# Patient Record
Sex: Female | Born: 1975 | Race: White | Hispanic: No | Marital: Married | Smoking: Never smoker
Health system: Southern US, Community
[De-identification: ages and names within clinical notes are randomized; demographics above are authoritative.]

## PROBLEM LIST (undated history)

## (undated) DIAGNOSIS — G43909 Migraine, unspecified, not intractable, without status migrainosus: Secondary | ICD-10-CM

## (undated) DIAGNOSIS — E109 Type 1 diabetes mellitus without complications: Secondary | ICD-10-CM

## (undated) DIAGNOSIS — G4730 Sleep apnea, unspecified: Secondary | ICD-10-CM

## (undated) DIAGNOSIS — M7502 Adhesive capsulitis of left shoulder: Secondary | ICD-10-CM

## (undated) DIAGNOSIS — K219 Gastro-esophageal reflux disease without esophagitis: Secondary | ICD-10-CM

## (undated) DIAGNOSIS — E782 Mixed hyperlipidemia: Secondary | ICD-10-CM

## (undated) DIAGNOSIS — Z794 Long term (current) use of insulin: Secondary | ICD-10-CM

## (undated) DIAGNOSIS — G4731 Primary central sleep apnea: Secondary | ICD-10-CM

## (undated) DIAGNOSIS — L680 Hirsutism: Secondary | ICD-10-CM

## (undated) DIAGNOSIS — K5790 Diverticulosis of intestine, part unspecified, without perforation or abscess without bleeding: Secondary | ICD-10-CM

## (undated) DIAGNOSIS — G5603 Carpal tunnel syndrome, bilateral upper limbs: Secondary | ICD-10-CM

## (undated) DIAGNOSIS — F419 Anxiety disorder, unspecified: Secondary | ICD-10-CM

## (undated) DIAGNOSIS — E063 Autoimmune thyroiditis: Secondary | ICD-10-CM

## (undated) DIAGNOSIS — E039 Hypothyroidism, unspecified: Secondary | ICD-10-CM

## (undated) DIAGNOSIS — E282 Polycystic ovarian syndrome: Secondary | ICD-10-CM

## (undated) HISTORY — DX: Hirsutism: L68.0

## (undated) HISTORY — DX: Polycystic ovarian syndrome: E28.2

## (undated) HISTORY — PX: TRIGGER FINGER RELEASE: SHX641

## (undated) HISTORY — DX: Anxiety disorder, unspecified: F41.9

## (undated) HISTORY — DX: Sleep apnea, unspecified: G47.30

## (undated) HISTORY — DX: Hypothyroidism, unspecified: E03.9

## (undated) HISTORY — PX: ULNAR NERVE DECOMPRESSION: SHX7404

## (undated) HISTORY — DX: Gastro-esophageal reflux disease without esophagitis: K21.9

## (undated) HISTORY — DX: Type 1 diabetes mellitus without complications: E10.9

---

## 2000-11-21 HISTORY — PX: DILATION AND CURETTAGE, DIAGNOSTIC / THERAPEUTIC: SUR384

## 2002-11-21 HISTORY — PX: TUBAL LIGATION: SHX77

## 2006-11-21 HISTORY — PX: HYSTEROSCOPY W/ ENDOMETRIAL ABLATION: SUR665

## 2006-11-21 HISTORY — PX: ABLATION: SHX5711

## 2012-11-21 HISTORY — PX: CHOLECYSTECTOMY: SHX55

## 2014-11-21 HISTORY — PX: CARPAL TUNNEL RELEASE: SHX101

## 2016-11-21 HISTORY — PX: CARPAL TUNNEL RELEASE: SHX101

## 2017-11-21 HISTORY — PX: OTHER SURGICAL HISTORY: SHX169

## 2019-11-22 HISTORY — PX: BREAST BIOPSY: SHX20

## 2020-11-05 LAB — HM MAMMOGRAPHY

## 2021-06-02 LAB — HM MAMMOGRAPHY

## 2021-09-04 ENCOUNTER — Emergency Department: Payer: BC Managed Care – PPO

## 2021-09-04 ENCOUNTER — Encounter: Payer: Self-pay | Admitting: Emergency Medicine

## 2021-09-04 ENCOUNTER — Other Ambulatory Visit: Payer: Self-pay

## 2021-09-04 ENCOUNTER — Emergency Department
Admission: EM | Admit: 2021-09-04 | Discharge: 2021-09-04 | Disposition: A | Discharge status: Home / Self Care | Payer: BC Managed Care – PPO | Attending: Emergency Medicine | Admitting: Emergency Medicine

## 2021-09-04 DIAGNOSIS — M79641 Pain in right hand: Secondary | ICD-10-CM | POA: Insufficient documentation

## 2021-09-04 DIAGNOSIS — W010XXA Fall on same level from slipping, tripping and stumbling without subsequent striking against object, initial encounter: Secondary | ICD-10-CM | POA: Insufficient documentation

## 2021-09-04 MED ORDER — IBUPROFEN 800 MG PO TABS: 800.0000 mg | ORAL_TABLET | Freq: Three times a day (TID) | ORAL | 0 refills | Status: DC | PRN

## 2021-09-04 MED ORDER — OXYCODONE-ACETAMINOPHEN 7.5-325 MG PO TABS: 1.0000 | ORAL_TABLET | Freq: Four times a day (QID) | ORAL | 0 refills | Status: DC | PRN

## 2021-09-04 NOTE — ED Triage Notes (Signed)
Pt reports her dog tripped her last pm and she fell hurting her right hand. Pt states she went to [facility] last om but they did the x-ray wrong and didn't stabilize it and she needs it checked properly and stabilized/splinted correctly

## 2021-09-04 NOTE — Discharge Instructions (Addendum)
No acute findings x-ray of the right hand.  Wear splint and follow-up with orthopedics by calling for an appointment on Monday morning.

## 2021-09-04 NOTE — ED Provider Notes (Signed)
[facility] Emergency Department Provider Note   ____________________________________________   Event Date/Time   First MD Initiated Contact with Patient 09/04/21 1048     (approximate)  I have reviewed the triage vital signs and the nursing notes.   HISTORY  Chief Complaint Hand Injury    HPI Ashley Fox is a 45 y.o. female patient presents for reevaluation of right wrist pain secondary to a trip and fall last night.  Patient states she went to the urgent care clinic was not satisfied with the care that was given.  Patient stated x-ray was performed wrong and he did not stabilize her wrist.  Patient is right-hand dominant.  No obvious deformity, edema, erythema to the right wrist.  Patient is moderate guarding palpation at the distal radius.  Patient has decreased range of motion with hyper flexion.  Patient rates the pain a 7/10.  Patient described pain as "achy".  Ace wrap was applied at the urgent care clinic.         History reviewed. No pertinent past medical history.  There are no problems to display for this patient.   History reviewed. No pertinent surgical history.  Prior to Admission medications   Medication Sig Start Date End Date Taking? Authorizing Provider  ibuprofen (ADVIL) 800 MG tablet Take 1 tablet (800 mg total) by mouth every 8 (eight) hours as needed for moderate pain. 09/04/21  Yes Joni Reining, PA-C  oxyCODONE-acetaminophen (PERCOCET) 7.5-325 MG tablet Take 1 tablet by mouth every 6 (six) hours as needed for severe pain. 09/04/21  Yes Joni Reining, PA-C    Allergies Erythromycin and Sulfa antibiotics  No family history on file.  Social History    Review of Systems  Constitutional: No fever/chills Eyes: No visual changes. ENT: No sore throat. Cardiovascular: Denies chest pain. Respiratory: Denies shortness of breath. Gastrointestinal: No abdominal pain.  No nausea, no vomiting.  No diarrhea.  No  constipation. Genitourinary: Negative for dysuria. Musculoskeletal: Negative for back pain. Skin: Negative for rash. Neurological: Negative for headaches, focal weakness or numbness. Allergic/Immunilogical: Erythromycin and sulfa antibiotics. ____________________________________________   PHYSICAL EXAM:  VITAL SIGNS: ED Triage Vitals  Enc Vitals Group     BP 09/04/21 1043 121/66     Pulse Rate 09/04/21 1043 82     Resp 09/04/21 1043 14     Temp 09/04/21 1043 97.8 F (36.6 C)     Temp Source 09/04/21 1043 Oral     SpO2 09/04/21 1043 98 %     Weight 09/04/21 1033 175 lb (79.4 kg)     Height 09/04/21 1033 5\' 5"  (1.651 m)     Head Circumference --      Peak Flow --      Pain Score 09/04/21 1033 7     Pain Loc --      Pain Edu? --      Excl. in GC? --     Constitutional: Alert and oriented. Well appearing and in no acute distress. Nose: No congestion/rhinnorhea. Mouth/Throat: Mucous membranes are moist.  Oropharynx non-erythematous. Neck: No stridor.  No cervical spine tenderness to palpation. Hematological/Lymphatic/Immunilogical: No cervical lymphadenopathy. Cardiovascular: Normal rate, regular rhythm. Grossly normal heart sounds.  Good peripheral circulation. Respiratory: Normal respiratory effort.  No retractions. Lungs CTAB. Musculoskeletal: No obvious deformity to the right wrist.  Decreased range of motion with hyperflexion.   Neurologic:  Normal speech and language. No gross focal neurologic deficits are appreciated. No gait instability. Skin:  Skin  is warm, dry and intact.  No abrasion, ecchymosis, or edema.  Psychiatric: Mood and affect are normal. Speech and behavior are normal.  ____________________________________________   LABS (all labs ordered are listed, but only abnormal results are displayed)  Labs Reviewed - No data to display ____________________________________________  EKG   ____________________________________________  RADIOLOGY I, Joni Reining, personally viewed and evaluated these images (plain radiographs) as part of my medical decision making, as well as reviewing the written report by the radiologist.  ED MD interpretation: No acute findings x-ray of the right hand.  Official radiology report(s): DG Hand Complete Right  Result Date: 09/04/2021 CLINICAL DATA:  Fall, right hand pain EXAM: RIGHT HAND - COMPLETE 3+ VIEW COMPARISON:  None. FINDINGS: There is no acute fracture or dislocation. Bony alignment is normal. The joint spaces are preserved. There is no erosive change. The soft tissues are unremarkable. IMPRESSION: No acute fracture or dislocation. Electronically Signed   By: Lesia Hausen M.D.   On: 09/04/2021 11:20    ____________________________________________   PROCEDURES  Procedure(s) performed (including Critical Care):  Procedures   ____________________________________________   INITIAL IMPRESSION / ASSESSMENT AND PLAN / ED COURSE  As part of my medical decision making, I reviewed the following data within the electronic MEDICAL RECORD NUMBER         Patient presents for reevaluation of right hand pain secondary to a trip and fall.  Discussed with patient no acute findings on x-ray.  Patient complaint and physical exam consistent with hyperextension injury of the right hand.  Patient placed in a splint and given discharge care instructions.  Patient advised if no improvement in 2 days follow orthopedic listed in her discharge care instructions.      ____________________________________________   FINAL CLINICAL IMPRESSION(S) / ED DIAGNOSES  Final diagnoses:  Right hand pain     ED Discharge Orders          Ordered    ibuprofen (ADVIL) 800 MG tablet  Every 8 hours PRN        09/04/21 1138    oxyCODONE-acetaminophen (PERCOCET) 7.5-325 MG tablet  Every 6 hours PRN        09/04/21 1138             Note:  This document was prepared using Dragon voice recognition software and may include  unintentional dictation errors.    Joni Reining, PA-C 09/04/21 1146    Sharyn Creamer, MD 09/04/21 [phone]

## 2021-09-15 ENCOUNTER — Other Ambulatory Visit: Payer: Self-pay | Admitting: Physician Assistant

## 2021-09-15 DIAGNOSIS — M25531 Pain in right wrist: Secondary | ICD-10-CM

## 2021-09-24 ENCOUNTER — Ambulatory Visit: Admission: RE | Admit: 2021-09-24 | Payer: BC Managed Care – PPO | Source: Ambulatory Visit

## 2021-12-28 ENCOUNTER — Ambulatory Visit: Payer: BC Managed Care – PPO | Admitting: Obstetrics and Gynecology

## 2021-12-28 ENCOUNTER — Other Ambulatory Visit (HOSPITAL_COMMUNITY)
Admission: RE | Admit: 2021-12-28 | Discharge: 2021-12-28 | Disposition: A | Discharge status: Home / Self Care | Payer: BC Managed Care – PPO | Source: Ambulatory Visit | Attending: Obstetrics and Gynecology | Admitting: Obstetrics and Gynecology

## 2021-12-28 ENCOUNTER — Encounter: Payer: Self-pay | Admitting: Obstetrics and Gynecology

## 2021-12-28 ENCOUNTER — Other Ambulatory Visit: Payer: Self-pay

## 2021-12-28 VITALS — Ht 65.0 in | Wt 179.8 lb

## 2021-12-28 DIAGNOSIS — Z1231 Encounter for screening mammogram for malignant neoplasm of breast: Secondary | ICD-10-CM | POA: Diagnosis not present

## 2021-12-28 DIAGNOSIS — Z01419 Encounter for gynecological examination (general) (routine) without abnormal findings: Secondary | ICD-10-CM | POA: Diagnosis not present

## 2021-12-28 DIAGNOSIS — R102 Pelvic and perineal pain: Secondary | ICD-10-CM

## 2021-12-28 DIAGNOSIS — N951 Menopausal and female climacteric states: Secondary | ICD-10-CM

## 2021-12-28 NOTE — Progress Notes (Signed)
HPI:      Ashley Fox is a 46 y.o. [phone] who LMP was No LMP recorded. Patient has had an ablation.  Subjective:   She presents today for her annual examination.  She is also here to establish care at [facility]. She is due for a mammogram.  She did have a Pap Co-test a little more than 3 years ago. She is an insulin-dependent diabetic who is seen regularly by endocrinology. She has previously had a tubal ligation for birth control and an endometrial ablation for very heavy menstrual bleeding. She has recently begun to have signs and symptoms of her menses despite not having any bleeding.  These include pelvic pressure and cramping, occasional diarrhea. She has also stated that she has had issues with premenopausal symptoms including hot flashes and night sweats for many years.    Hx: The following portions of the patient's history were reviewed and updated as appropriate:             She  has a past medical history of Acid reflux, Anxiety, Hirsutism, Hypothyroid, PCOS (polycystic ovarian syndrome), Sleep apnea, and Type 1 diabetes ([place]). She does not have a problem list on file. She  has a past surgical history that includes Dilation and curettage, diagnostic / therapeutic (2002); Cesarean section (2004); Tubal ligation (2004); Ablation (2008); Cholecystectomy (2014); Carpal tunnel release (Right, 2016); Carpal tunnel release (Left, 2018); and Other surgical history (Right, 2019). Her family history includes Cirrhosis in her paternal grandmother; Crohn's disease in her father; Dementia in her maternal grandmother; Diabetes type I in her paternal grandfather; Diabetes type II in her father; Heart attack in her maternal grandfather; Heart disease in her maternal uncle, maternal uncle, and paternal grandfather; Hypertension in her father, maternal grandfather, maternal grandmother, mother, and paternal grandfather; Lung cancer in her paternal aunt; Sick sinus syndrome in her father. She  reports  that she has never smoked. She has never used smokeless tobacco. She reports current alcohol use. She reports that she does not use drugs. She has a current medication list which includes the following prescription(s): cyanocobalamin, empagliflozin, ergocalciferol, escitalopram, fenofibrate, insulin aspart, tresiba flextouch, levocetirizine dihydrochloride, levothyroxine, omeprazole, rosuvastatin, semaglutide, zolpidem tartrate, ibuprofen, and oxycodone-acetaminophen. She is allergic to avocado, erythromycin, imitrex [sumatriptan], and sulfa antibiotics.       Review of Systems:  Review of Systems  Constitutional: Denied constitutional symptoms, night sweats, recent illness, fatigue, fever, insomnia and weight loss.  Eyes: Denied eye symptoms, eye pain, photophobia, vision change and visual disturbance.  Ears/Nose/Throat/Neck: Denied ear, nose, throat or neck symptoms, hearing loss, nasal discharge, sinus congestion and sore throat.  Cardiovascular: Denied cardiovascular symptoms, arrhythmia, chest pain/pressure, edema, exercise intolerance, orthopnea and palpitations.  Respiratory: Denied pulmonary symptoms, asthma, pleuritic pain, productive sputum, cough, dyspnea and wheezing.  Gastrointestinal: Denied, gastro-esophageal reflux, melena, nausea and vomiting.  Genitourinary: See HPI for additional information.  Musculoskeletal: Denied musculoskeletal symptoms, stiffness, swelling, muscle weakness and myalgia.  Dermatologic: Denied dermatology symptoms, rash and scar.  Neurologic: Denied neurology symptoms, dizziness, headache, neck pain and syncope.  Psychiatric: Denied psychiatric symptoms, anxiety and depression.  Endocrine: See HPI for additional information.   Meds:   Current Outpatient Medications on File Prior to Visit  Medication Sig Dispense Refill   Cyanocobalamin (B-12 COMPLIANCE INJECTION IJ) Inject as directed every 30 (thirty) days.     empagliflozin (JARDIANCE) 25 MG TABS  tablet Take by mouth daily.     ergocalciferol (VITAMIN D2) 1.25 MG (50000 UT) capsule Take 50,000  Units by mouth once a week.     escitalopram (LEXAPRO) 20 MG tablet Take 20 mg by mouth daily.     fenofibrate (TRICOR) 145 MG tablet Take 145 mg by mouth daily.     Insulin Aspart (NOVOLOG FLEXPEN [place]) Inject into the skin.     insulin degludec (TRESIBA FLEXTOUCH) 200 UNIT/ML FlexTouch Pen Inject 70 Units into the skin in the morning and at bedtime.     Levocetirizine Dihydrochloride (XYZAL PO) Take 5 mg by mouth.     levothyroxine (SYNTHROID) 88 MCG tablet Take 88 mcg by mouth daily before breakfast.     omeprazole (PRILOSEC) 20 MG capsule Take 20 mg by mouth daily.     rosuvastatin (CRESTOR) 10 MG tablet Take 10 mg by mouth daily.     Semaglutide (OZEMPIC, 1 MG/DOSE, [place]) Inject into the skin once a week.     Zolpidem Tartrate (AMBIEN PO) Take by mouth. PRN     ibuprofen (ADVIL) 800 MG tablet Take 1 tablet (800 mg total) by mouth every 8 (eight) hours as needed for moderate pain. 15 tablet 0   oxyCODONE-acetaminophen (PERCOCET) 7.5-325 MG tablet Take 1 tablet by mouth every 6 (six) hours as needed for severe pain. 12 tablet 0   No current facility-administered medications on file prior to visit.     Objective:     There were no vitals filed for this visit.  Filed Weights   12/28/21 0952  Weight: 179 lb 12.8 oz (81.6 kg)              Physical examination General NAD, Conversant  HEENT Atraumatic; Op clear with mmm.  Normo-cephalic. Pupils reactive. Anicteric sclerae  Thyroid/Neck Smooth without nodularity or enlargement. Normal ROM.  Neck Supple.  Skin No rashes, lesions or ulceration. Normal palpated skin turgor. No nodularity.  Breasts: No masses or discharge.  Symmetric.  No axillary adenopathy.  Lungs: Clear to auscultation.No rales or wheezes. Normal Respiratory effort, no retractions.  Heart: NSR.  No murmurs or rubs appreciated. No periferal edema  Abdomen: Soft.  Non-tender.   No masses.  No HSM. No hernia  Extremities: Moves all appropriately.  Normal ROM for age. No lymphadenopathy.  Neuro: Oriented to PPT.  Normal mood. Normal affect.     Pelvic:   Vulva: Normal appearance.  No lesions.  Vagina: No lesions or abnormalities noted.  Support: Normal pelvic support.  Urethra No masses tenderness or scarring.  Meatus Normal size without lesions or prolapse.  Cervix: Normal appearance.  No lesions.  Anus: Normal exam.  No lesions.  Perineum: Normal exam.  No lesions.        Bimanual   Uterus: Normal size.  Non-tender.  Mobile.  AV.  Adnexae: No masses.  Non-tender to palpation.  Cul-de-sac: Negative for abnormality.     Assessment:    MD:[id number] There are no problems to display for this patient.    1. Well woman exam with routine gynecological exam   2. Screening mammogram for breast cancer   3. Symptomatic menopausal or female climacteric states   4. Pelvic pain in female     Pelvic pain discussed.  Future ultrasound the possibility if pain becomes more significant.  Tubal ligation ablation syndrome discussed (patient will explore this condition in "up-to-date")   Plan:            1.  Basic Screening Recommendations The basic screening recommendations for asymptomatic women were discussed with the patient during her visit.  The age-appropriate recommendations  were discussed with her and the rational for the tests reviewed.  When I am informed by the patient that another primary care physician has previously obtained the age-appropriate tests and they are up-to-date, only outstanding tests are ordered and referrals given as necessary.  Abnormal results of tests will be discussed with her when all of her results are completed.  Routine preventative health maintenance measures emphasized: Exercise/Diet/Weight control, Tobacco Warnings, Alcohol/Substance use risks and Stress Management Pap Co-test performed-mammogram ordered 2.  FSH to rule out menopause -if  elevated consider discussion regarding ERT. 3.  Patient will inform us if her pelvic pain worsens -consider future ultrasound  Orders Orders Placed This Encounter  Procedures   MM DIGITAL SCREENING BILATERAL   Follicle stimulating hormone    No orders of the defined types were placed in this encounter.         F/U  Return in about 1 year (around 12/28/2022) for We will contact her with any abnormal test results, Pt to contact us if symptoms worsen, Annual.  Finis Bud, M.D. 12/28/2021 10:47 AM

## 2021-12-28 NOTE — Progress Notes (Signed)
Patient presents today for annual exam. Patient states a history of ablation, due to heavy bleeding and tubal ligation. Patient states experiencing cramps, headaches, diarrhea every 2-3 weeks starting 4-6 months ago. Patient also states experiencing perimenopausal symptoms of vaginal dryness, night seats and occasional urinary leakage. Patient is due for pap smear and mammogram has been ordered. Patient states no other questions or concerns at this time.

## 2021-12-29 LAB — CYTOLOGY - PAP
Comment: NEGATIVE
Diagnosis: NEGATIVE
High risk HPV: NEGATIVE

## 2021-12-29 LAB — FOLLICLE STIMULATING HORMONE: FSH: 8.6 m[IU]/mL

## 2022-02-15 ENCOUNTER — Ambulatory Visit: Payer: BC Managed Care – PPO | Admitting: Nurse Practitioner

## 2022-02-15 ENCOUNTER — Other Ambulatory Visit: Payer: Self-pay

## 2022-02-15 DIAGNOSIS — R300 Dysuria: Secondary | ICD-10-CM

## 2022-02-15 DIAGNOSIS — N3090 Cystitis, unspecified without hematuria: Secondary | ICD-10-CM

## 2022-02-15 LAB — POCT URINALYSIS DIPSTICK OB
Bilirubin, UA: NEGATIVE
Ketones, UA: NEGATIVE
Nitrite, UA: NEGATIVE
Spec Grav, UA: 1.015 (ref 1.010–1.025)
Urobilinogen, UA: NEGATIVE E.U./dL — AB
pH, UA: 7 (ref 5.0–8.0)

## 2022-02-15 MED ORDER — NITROFURANTOIN MONOHYD MACRO 100 MG PO CAPS: 100.0000 mg | ORAL_CAPSULE | Freq: Two times a day (BID) | ORAL | 0 refills | Status: AC

## 2022-02-15 NOTE — Progress Notes (Signed)
46 year old female presenting with dysuria, frequency and urgency for the past 3 days. Slight back pain today.She did have some nausea yesterday no fever.  ? ?Allergies  ?Allergen Reactions  ? Avocado Anaphylaxis  ? Erythromycin Nausea And Vomiting  ? Imitrex [Sumatriptan] Other (See Comments)  ?  Head feels like it is going to "explode"  ? Sulfa Antibiotics Hives  ?  ?Recent Results (from the past 2160 hour(s))  ?Cytology - PAP     Status: None  ? Collection Time: 12/28/21 10:24 AM  ?Result Value Ref Range  ? High risk HPV Negative   ? Adequacy    ?  Satisfactory for evaluation; transformation zone component PRESENT.  ? Diagnosis    ?  - Negative for intraepithelial lesion or malignancy (NILM)  ? Comment Normal Reference Range HPV - Negative   ?Follicle stimulating hormone     Status: None  ? Collection Time: 12/28/21 11:05 AM  ?Result Value Ref Range  ? FSH 8.6 mIU/mL  ?  Comment:                     Adult Female: ?                      Follicular phase      3.5 -  12.5 ?                      Ovulation phase       4.7 -  21.5 ?                      Luteal phase          1.7 -   7.7 ?                      Postmenopausal       25.8 - 134.8 ?  ?POC Urinalysis Dipstick OB     Status: Abnormal  ? Collection Time: 02/15/22  8:04 AM  ?Result Value Ref Range  ? Color, UA amber   ? Clarity, UA cloudy   ? Glucose, UA Moderate (2+) (A) Negative  ? Bilirubin, UA negative   ? Ketones, UA negative   ? Spec Grav, UA 1.015 1.010 - 1.025  ? Blood, UA large   ? pH, UA 7.0 5.0 - 8.0  ? POC,PROTEIN,UA Small (1+) Negative, Trace, Small (1+), Moderate (2+), Large (3+), 4+  ? Urobilinogen, UA negative (A) 0.2 or 1.0 E.U./dL  ? Nitrite, UA negative   ? Leukocytes, UA Small (1+) (A) Negative  ? Appearance    ? Odor    ?  ?1. Cystitis ? ?- nitrofurantoin, macrocrystal-monohydrate, (MACROBID) 100 MG capsule; Take 1 capsule (100 mg total) by mouth 2 (two) times daily for 5 days.  Dispense: 10 capsule; Refill: 0 ? ?2. Dysuria ? ?- POC  Urinalysis Dipstick OB ?- Urine Culture ?   ? ?Will followup with culture results when available  ? ?

## 2022-02-16 MED ORDER — CEPHALEXIN 500 MG PO CAPS: 500.0000 mg | ORAL_CAPSULE | Freq: Two times a day (BID) | ORAL | 0 refills | Status: AC

## 2022-02-16 NOTE — Addendum Note (Signed)
Addended by: Harlow Mares on: 02/16/2022 07:37 AM ? ? Modules accepted: Orders ? ?

## 2022-02-17 ENCOUNTER — Ambulatory Visit
Admission: RE | Admit: 2022-02-17 | Discharge: 2022-02-17 | Disposition: A | Discharge status: Home / Self Care | Payer: BC Managed Care – PPO | Source: Ambulatory Visit | Attending: Obstetrics and Gynecology | Admitting: Obstetrics and Gynecology

## 2022-02-17 DIAGNOSIS — Z01419 Encounter for gynecological examination (general) (routine) without abnormal findings: Secondary | ICD-10-CM | POA: Insufficient documentation

## 2022-02-17 DIAGNOSIS — Z1231 Encounter for screening mammogram for malignant neoplasm of breast: Secondary | ICD-10-CM | POA: Diagnosis present

## 2022-02-21 LAB — URINE CULTURE

## 2022-02-22 ENCOUNTER — Encounter: Payer: Self-pay | Admitting: Nurse Practitioner

## 2022-02-23 ENCOUNTER — Inpatient Hospital Stay
Admission: RE | Admit: 2022-02-23 | Discharge: 2022-02-23 | Disposition: A | Discharge status: Home / Self Care | Payer: Self-pay | Source: Ambulatory Visit | Attending: *Deleted | Admitting: *Deleted

## 2022-02-23 ENCOUNTER — Other Ambulatory Visit: Payer: Self-pay | Admitting: *Deleted

## 2022-02-23 DIAGNOSIS — Z1231 Encounter for screening mammogram for malignant neoplasm of breast: Secondary | ICD-10-CM

## 2022-06-20 DIAGNOSIS — K5730 Diverticulosis of large intestine without perforation or abscess without bleeding: Secondary | ICD-10-CM | POA: Diagnosis not present

## 2022-06-20 DIAGNOSIS — K640 First degree hemorrhoids: Secondary | ICD-10-CM | POA: Diagnosis not present

## 2022-06-20 HISTORY — PX: COLONOSCOPY: SHX174

## 2022-11-09 IMAGING — MG MM DIGITAL SCREENING BILAT W/ TOMO AND CAD
8 series · 8 of 24 positions shown · non-contrast
Comparison: Previous exam(s).

CLINICAL DATA: Screening.

EXAM:
DIGITAL SCREENING BILATERAL MAMMOGRAM WITH TOMOSYNTHESIS AND CAD
TECHNIQUE: Bilateral screening digital craniocaudal and mediolateral oblique
mammograms were obtained. Bilateral screening digital breast
tomosynthesis was performed. The images were evaluated with
computer-aided detection.

[R MLO synth-2D]
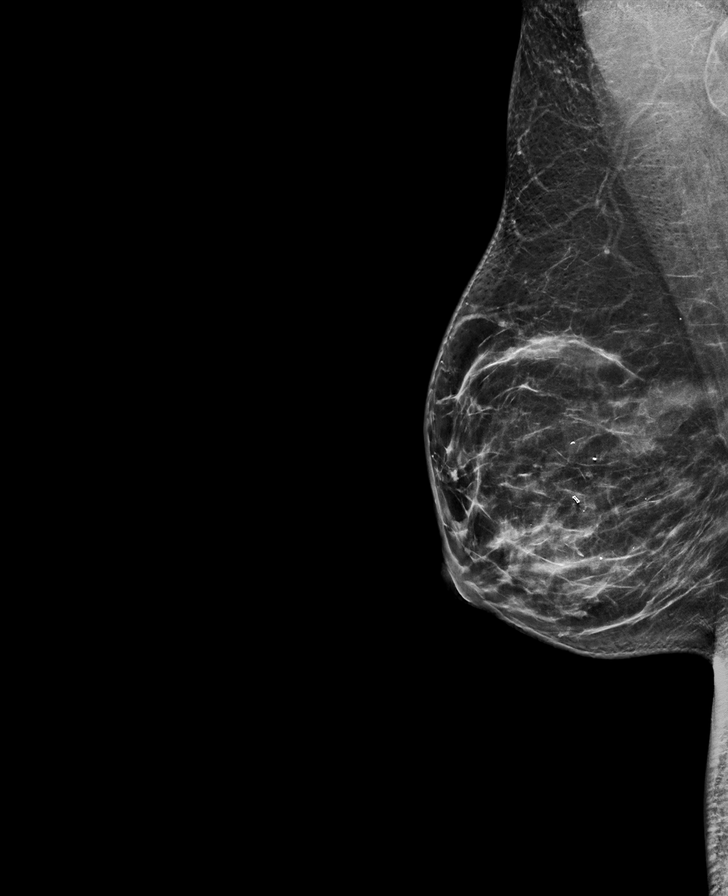

[L CC synth-2D]
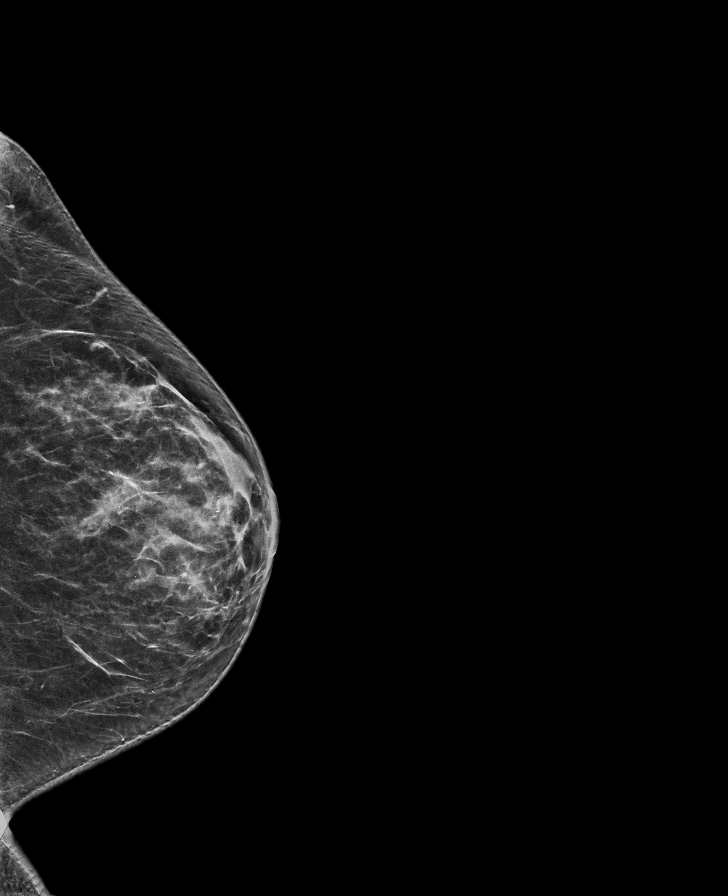

[L MLO synth-2D]
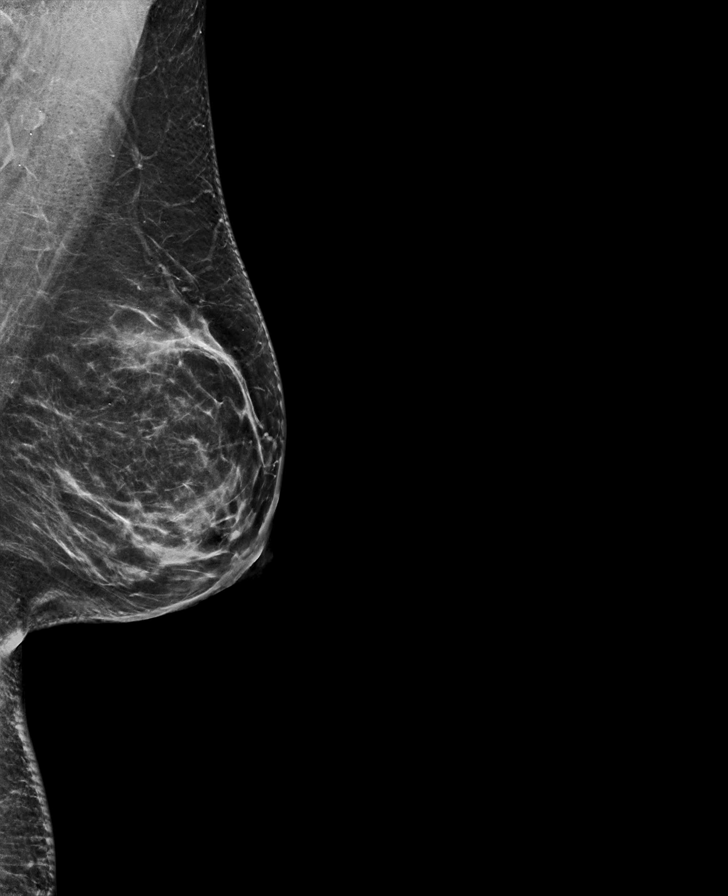

[R CC synth-2D]
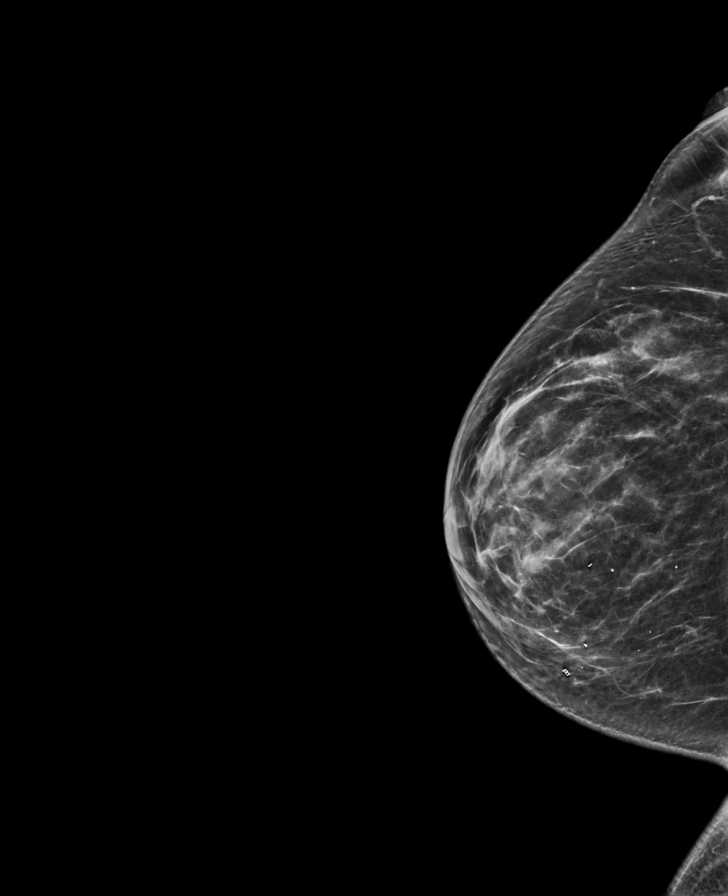

[L CC tomo · tomo slice 37/74.0]
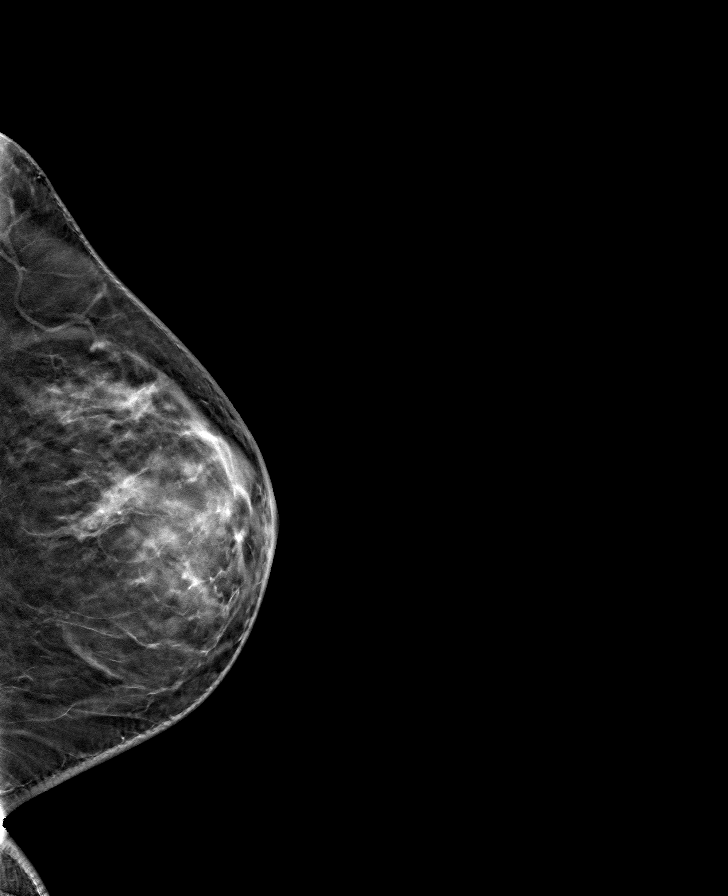

[L MLO tomo · tomo slice 41/80.0]
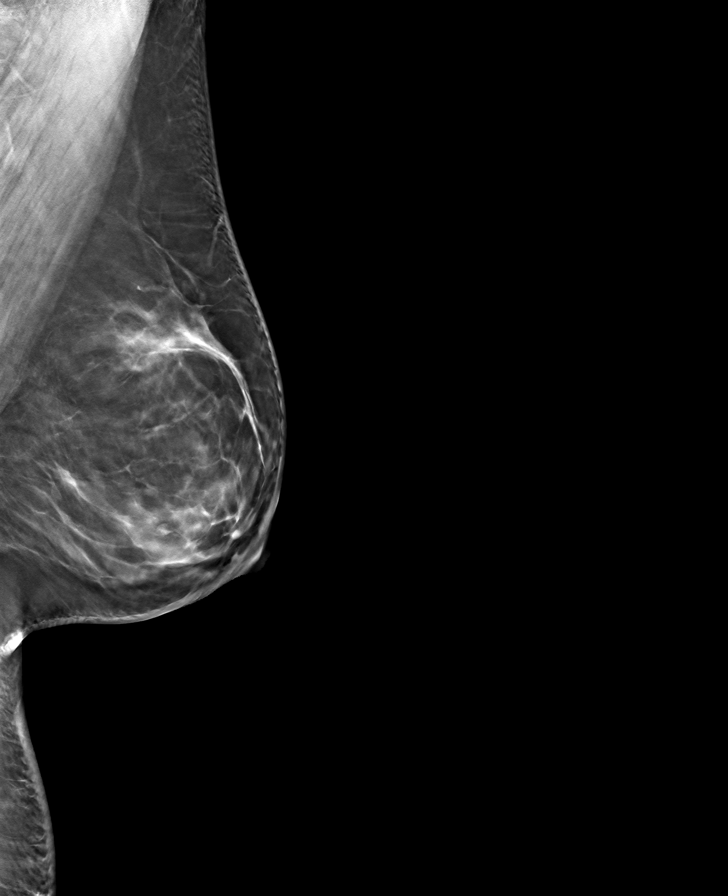

[R CC tomo · tomo slice 39/76.0]
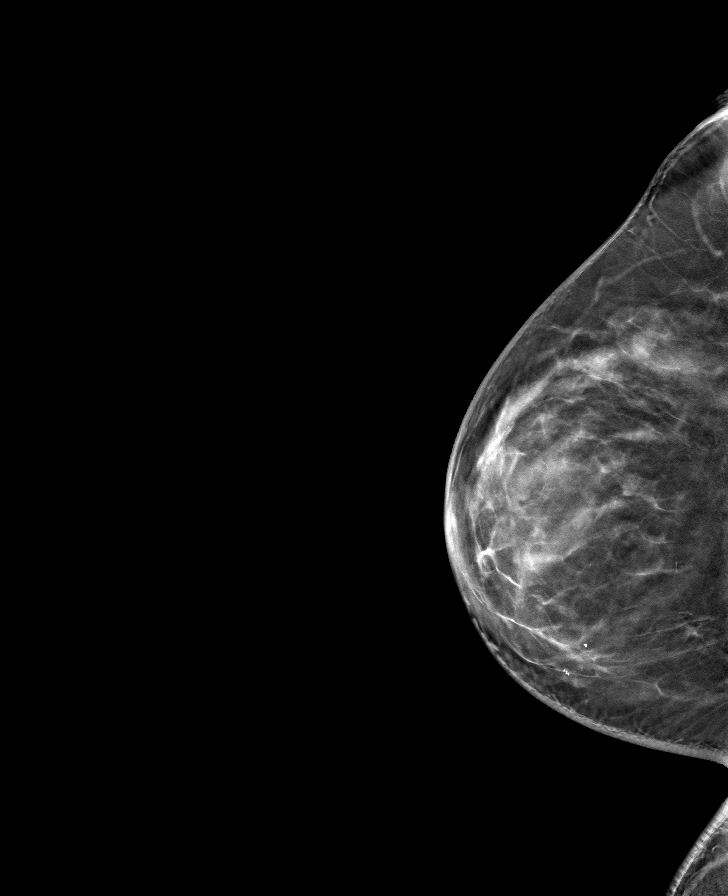

[R MLO tomo · tomo slice 41/81.0]
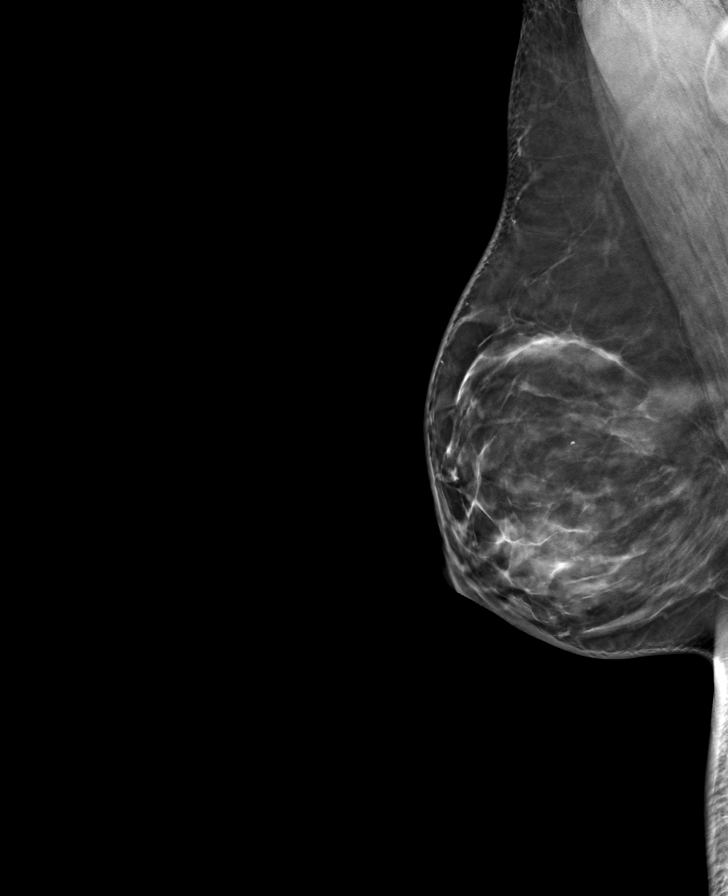

[8 of 24 positions shown; findings below may reference images not displayed]

ACR Breast Density Category c: The breast tissue is heterogeneously
dense, which may obscure small masses.
FINDINGS: There are no findings suspicious for malignancy.
IMPRESSION: No mammographic evidence of malignancy. A result letter of this
screening mammogram will be mailed directly to the patient.

RECOMMENDATION:
Screening mammogram in one year. (Code:Q3-W-BC3)

BI-RADS CATEGORY  1: Negative.

## 2022-11-28 DIAGNOSIS — Z794 Long term (current) use of insulin: Secondary | ICD-10-CM | POA: Diagnosis not present

## 2022-11-28 DIAGNOSIS — E782 Mixed hyperlipidemia: Secondary | ICD-10-CM | POA: Diagnosis not present

## 2022-11-28 DIAGNOSIS — E113293 Type 2 diabetes mellitus with mild nonproliferative diabetic retinopathy without macular edema, bilateral: Secondary | ICD-10-CM | POA: Diagnosis not present

## 2022-12-05 DIAGNOSIS — Z794 Long term (current) use of insulin: Secondary | ICD-10-CM | POA: Diagnosis not present

## 2022-12-05 DIAGNOSIS — E782 Mixed hyperlipidemia: Secondary | ICD-10-CM | POA: Diagnosis not present

## 2022-12-05 DIAGNOSIS — E038 Other specified hypothyroidism: Secondary | ICD-10-CM | POA: Diagnosis not present

## 2022-12-05 DIAGNOSIS — E113293 Type 2 diabetes mellitus with mild nonproliferative diabetic retinopathy without macular edema, bilateral: Secondary | ICD-10-CM | POA: Diagnosis not present

## 2022-12-05 DIAGNOSIS — E063 Autoimmune thyroiditis: Secondary | ICD-10-CM | POA: Diagnosis not present

## 2022-12-23 DIAGNOSIS — E538 Deficiency of other specified B group vitamins: Secondary | ICD-10-CM | POA: Diagnosis not present

## 2022-12-23 DIAGNOSIS — E559 Vitamin D deficiency, unspecified: Secondary | ICD-10-CM | POA: Diagnosis not present

## 2022-12-23 DIAGNOSIS — E039 Hypothyroidism, unspecified: Secondary | ICD-10-CM | POA: Diagnosis not present

## 2022-12-23 DIAGNOSIS — E1069 Type 1 diabetes mellitus with other specified complication: Secondary | ICD-10-CM | POA: Diagnosis not present

## 2022-12-23 DIAGNOSIS — G4700 Insomnia, unspecified: Secondary | ICD-10-CM | POA: Diagnosis not present

## 2022-12-23 DIAGNOSIS — E1065 Type 1 diabetes mellitus with hyperglycemia: Secondary | ICD-10-CM | POA: Diagnosis not present

## 2022-12-23 DIAGNOSIS — F411 Generalized anxiety disorder: Secondary | ICD-10-CM | POA: Diagnosis not present

## 2022-12-23 DIAGNOSIS — K219 Gastro-esophageal reflux disease without esophagitis: Secondary | ICD-10-CM | POA: Diagnosis not present

## 2022-12-23 DIAGNOSIS — Z1331 Encounter for screening for depression: Secondary | ICD-10-CM | POA: Diagnosis not present

## 2022-12-23 DIAGNOSIS — E10319 Type 1 diabetes mellitus with unspecified diabetic retinopathy without macular edema: Secondary | ICD-10-CM | POA: Diagnosis not present

## 2022-12-23 DIAGNOSIS — G4733 Obstructive sleep apnea (adult) (pediatric): Secondary | ICD-10-CM | POA: Diagnosis not present

## 2022-12-23 DIAGNOSIS — Z0000 Encounter for general adult medical examination without abnormal findings: Secondary | ICD-10-CM | POA: Diagnosis not present

## 2022-12-23 DIAGNOSIS — K582 Mixed irritable bowel syndrome: Secondary | ICD-10-CM | POA: Diagnosis not present

## 2022-12-29 ENCOUNTER — Encounter: Payer: BC Managed Care – PPO | Admitting: Obstetrics and Gynecology

## 2023-01-16 ENCOUNTER — Other Ambulatory Visit: Payer: Self-pay

## 2023-01-16 ENCOUNTER — Encounter: Payer: Self-pay | Admitting: *Deleted

## 2023-01-16 ENCOUNTER — Emergency Department
Admission: EM | Admit: 2023-01-16 | Discharge: 2023-01-17 | Discharge status: Against Medical Advice | Payer: BC Managed Care – PPO | Attending: Emergency Medicine | Admitting: Emergency Medicine

## 2023-01-16 DIAGNOSIS — Z5321 Procedure and treatment not carried out due to patient leaving prior to being seen by health care provider: Secondary | ICD-10-CM | POA: Insufficient documentation

## 2023-01-16 DIAGNOSIS — T7804XA Anaphylactic reaction due to fruits and vegetables, initial encounter: Secondary | ICD-10-CM | POA: Insufficient documentation

## 2023-01-16 DIAGNOSIS — R109 Unspecified abdominal pain: Secondary | ICD-10-CM | POA: Diagnosis present

## 2023-01-16 LAB — URINALYSIS, ROUTINE W REFLEX MICROSCOPIC
Bilirubin Urine: NEGATIVE
Glucose, UA: >=500 mg/dL — AB
Hgb urine dipstick: NEGATIVE
Ketones, ur: NEGATIVE mg/dL
Leukocytes,Ua: NEGATIVE
Nitrite: NEGATIVE
Protein, ur: NEGATIVE mg/dL
Specific Gravity, Urine: 1.039 — ABNORMAL HIGH (ref 1.005–1.030)
pH: 5 (ref 5.0–8.0)

## 2023-01-16 LAB — POC URINE PREG, ED: Preg Test, Ur: NEGATIVE

## 2023-01-16 NOTE — ED Triage Notes (Addendum)
Pt reports she is allergic to avocados and it was placed on her sandwich and then removed.  Pt has intense abd pain.  No resp distress.  Pt took 50 mg benadryl at 2045.    No v/d.  Pt alert  speech clear.    No rash.

## 2023-01-17 LAB — COMPREHENSIVE METABOLIC PANEL
ALT: 16 U/L (ref 0–44)
AST: 19 U/L (ref 15–41)
Albumin: 4.4 g/dL (ref 3.5–5.0)
Alkaline Phosphatase: 48 U/L (ref 38–126)
Anion gap: 11 (ref 5–15)
BUN: 16 mg/dL (ref 6–20)
CO2: 29 mmol/L (ref 22–32)
Calcium: 9.3 mg/dL (ref 8.9–10.3)
Chloride: 98 mmol/L (ref 98–111)
Creatinine, Ser: 0.71 mg/dL (ref 0.44–1.00)
GFR, Estimated: >60 mL/min (ref >60)
Glucose, Bld: 86 mg/dL (ref 70–99)
Potassium: 3.3 mmol/L — ABNORMAL LOW (ref 3.5–5.1)
Sodium: 138 mmol/L (ref 135–145)
Total Bilirubin: 0.8 mg/dL (ref 0.3–1.2)
Total Protein: 7.8 g/dL (ref 6.5–8.1)

## 2023-01-17 LAB — CBC
HCT: 45.9 % (ref 36.0–46.0)
Hemoglobin: 15.3 g/dL — ABNORMAL HIGH (ref 12.0–15.0)
MCH: 29.2 pg (ref 26.0–34.0)
MCHC: 33.3 g/dL (ref 30.0–36.0)
MCV: 87.6 fL (ref 80.0–100.0)
Platelets: 401 10*3/uL — ABNORMAL HIGH (ref 150–400)
RBC: 5.24 MIL/uL — ABNORMAL HIGH (ref 3.87–5.11)
RDW: 12.5 % (ref 11.5–15.5)
WBC: 12.3 10*3/uL — ABNORMAL HIGH (ref 4.0–10.5)
nRBC: 0 % (ref 0.0–0.2)

## 2023-01-17 LAB — LIPASE, BLOOD: Lipase: 33 U/L (ref 11–51)

## 2023-01-17 NOTE — ED Notes (Signed)
Pt to desk to inquire over wait time and updated on such; pt reports that she has a nut allergy; denies diff breathing or swallowing; no rashes; c/o abd pain; pt upset over wait time and st leaving now

## 2023-02-07 ENCOUNTER — Ambulatory Visit (INDEPENDENT_AMBULATORY_CARE_PROVIDER_SITE_OTHER): Payer: BC Managed Care – PPO | Admitting: Obstetrics and Gynecology

## 2023-02-07 ENCOUNTER — Encounter: Payer: Self-pay | Admitting: Obstetrics and Gynecology

## 2023-02-07 ENCOUNTER — Other Ambulatory Visit: Payer: Self-pay | Admitting: Obstetrics and Gynecology

## 2023-02-07 DIAGNOSIS — N951 Menopausal and female climacteric states: Secondary | ICD-10-CM

## 2023-02-07 DIAGNOSIS — Z1231 Encounter for screening mammogram for malignant neoplasm of breast: Secondary | ICD-10-CM

## 2023-02-07 DIAGNOSIS — Z01419 Encounter for gynecological examination (general) (routine) without abnormal findings: Secondary | ICD-10-CM | POA: Diagnosis not present

## 2023-02-07 NOTE — Progress Notes (Signed)
HPI:      [name] is a 47 y.o. [phone] who LMP was No LMP recorded (exact date). Patient has had an ablation.  Subjective:   She presents today for her annual examination.  She is not due for a Pap or mammogram at this time.  Her mammogram is scheduled for April.  She does state that she has begun to have nightly sweats and vaginal dryness and some pain with intercourse.  She did have some menopausal symptoms last year and was tested [facility]) but that was normal.  Her symptoms sound more severe at this time. She states that if she was found to be in menopause she would consider HRT but does not want to consider any treatment premenopausally as she feels as if she takes too many pills already.    Hx: The following portions of the patient's history were reviewed and updated as appropriate:             She  has a past medical history of Acid reflux, Anxiety, Hirsutism, Hypothyroid, PCOS (polycystic ovarian syndrome), Sleep apnea, and Type 1 diabetes ([place]). She does not have a problem list on file. She  has a past surgical history that includes Dilation and curettage, diagnostic / therapeutic (2002); Cesarean section (2004); Tubal ligation (2004); Ablation (2008); Cholecystectomy (2014); Carpal tunnel release (Right, 2016); Carpal tunnel release (Left, 2018); Other surgical history (Right, 2019); and Breast biopsy (Right, 2021). Her family history includes Cirrhosis in her paternal grandmother; Crohn's disease in her father; Dementia in her maternal grandmother; Diabetes type I in her paternal grandfather; Diabetes type II in her father; Heart attack in her maternal grandfather; Heart disease in her maternal uncle, maternal uncle, and paternal grandfather; Hypertension in her father, maternal grandfather, maternal grandmother, mother, and paternal grandfather; Lung cancer in her paternal aunt; Sick sinus syndrome in her father. She  reports that she has never smoked. She has never used  smokeless tobacco. She reports that she does not currently use alcohol. She reports that she does not use drugs. She has a current medication list which includes the following prescription(s): empagliflozin, ergocalciferol, escitalopram, fenofibrate, insulin aspart, tresiba flextouch, levocetirizine dihydrochloride, levothyroxine, omeprazole, rosuvastatin, mounjaro, and zolpidem tartrate. She is allergic to avocado, erythromycin, imitrex [sumatriptan], and sulfa antibiotics.       Review of Systems:  Review of Systems  Constitutional: Denied constitutional symptoms, night sweats, recent illness, fatigue, fever, insomnia and weight loss.  Eyes: Denied eye symptoms, eye pain, photophobia, vision change and visual disturbance.  Ears/Nose/Throat/Neck: Denied ear, nose, throat or neck symptoms, hearing loss, nasal discharge, sinus congestion and sore throat.  Cardiovascular: Denied cardiovascular symptoms, arrhythmia, chest pain/pressure, edema, exercise intolerance, orthopnea and palpitations.  Respiratory: Denied pulmonary symptoms, asthma, pleuritic pain, productive sputum, cough, dyspnea and wheezing.  Gastrointestinal: Denied, gastro-esophageal reflux, melena, nausea and vomiting.  Genitourinary: Denied genitourinary symptoms including symptomatic vaginal discharge, pelvic relaxation issues, and urinary complaints.  Musculoskeletal: Denied musculoskeletal symptoms, stiffness, swelling, muscle weakness and myalgia.  Dermatologic: Denied dermatology symptoms, rash and scar.  Neurologic: Denied neurology symptoms, dizziness, headache, neck pain and syncope.  Psychiatric: Denied psychiatric symptoms, anxiety and depression.  Endocrine: See HPI for additional information.   Meds:   Current Outpatient Medications on File Prior to Visit  Medication Sig Dispense Refill   empagliflozin (JARDIANCE) 25 MG TABS tablet Take by mouth daily.     ergocalciferol (VITAMIN D2) 1.25 MG (50000 UT) capsule Take  50,000 Units by mouth once a week.  escitalopram (LEXAPRO) 20 MG tablet Take 20 mg by mouth daily.     fenofibrate (TRICOR) 145 MG tablet Take 145 mg by mouth daily.     Insulin Aspart (NOVOLOG FLEXPEN [place]) Inject into the skin.     insulin degludec (TRESIBA FLEXTOUCH) 200 UNIT/ML FlexTouch Pen Inject 70 Units into the skin in the morning and at bedtime.     Levocetirizine Dihydrochloride (XYZAL PO) Take 5 mg by mouth.     levothyroxine (SYNTHROID) 88 MCG tablet Take 88 mcg by mouth daily before breakfast.     omeprazole (PRILOSEC) 20 MG capsule Take 20 mg by mouth daily.     rosuvastatin (CRESTOR) 10 MG tablet Take 10 mg by mouth daily.     tirzepatide (MOUNJARO) 10 MG/0.5ML Pen Inject 10 mg into the skin once a week.     Zolpidem Tartrate (AMBIEN PO) Take by mouth. PRN     No current facility-administered medications on file prior to visit.     Objective:     There were no vitals filed for this visit.  There were no vitals filed for this visit.            Patient declined examination  Assessment:    [id number] There are no problems to display for this patient.    1. Well woman exam with routine gynecological exam   2. Menopausal symptoms     Patient has menopausal symptoms likely in menopause or at the very least perimenopausal/climacteric.   Plan:            1.  Basic Screening Recommendations The basic screening recommendations for asymptomatic women were discussed with the patient during her visit.  The age-appropriate recommendations were discussed with her and the rational for the tests reviewed.  When I am informed by the patient that another primary care physician has previously obtained the age-appropriate tests and they are up-to-date, only outstanding tests are ordered and referrals given as necessary.  Abnormal results of tests will be discussed with her when all of her results are completed.  Routine preventative health maintenance measures emphasized:  Exercise/Diet/Weight control, Tobacco Warnings, Alcohol/Substance use risks and Stress Management 2.  [place] Patient to schedule a video visit [place] elevated consistent with menopause Orders Orders Placed This Encounter  Procedures   [place]    No orders of the defined types were placed in this encounter.         F/U  Return in about 1 year (around 02/07/2024) for We will contact her with any abnormal test results, Annual Physical.  Finis Bud, M.D. 02/07/2023 2:55 PM

## 2023-02-07 NOTE — Progress Notes (Signed)
Patients presents for annual exam today. She has complaints of vaginal dryness, hot flashes and night sweats. Patient is up to date on pap smear. She is due for mammogram, scheduled for 02/22/23. Annual labs are deferred to PCP. She states no other questions or concerns at this time.

## 2023-02-08 LAB — FOLLICLE STIMULATING HORMONE: FSH: 10.8 m[IU]/mL

## 2023-02-11 ENCOUNTER — Encounter: Payer: Self-pay | Admitting: Obstetrics and Gynecology

## 2023-02-22 ENCOUNTER — Ambulatory Visit
Admission: RE | Admit: 2023-02-22 | Discharge: 2023-02-22 | Disposition: A | Discharge status: Home / Self Care | Payer: BC Managed Care – PPO | Source: Ambulatory Visit | Attending: Obstetrics and Gynecology | Admitting: Obstetrics and Gynecology

## 2023-02-22 DIAGNOSIS — Z1231 Encounter for screening mammogram for malignant neoplasm of breast: Secondary | ICD-10-CM | POA: Diagnosis not present

## 2023-04-06 DIAGNOSIS — E113293 Type 2 diabetes mellitus with mild nonproliferative diabetic retinopathy without macular edema, bilateral: Secondary | ICD-10-CM | POA: Diagnosis not present

## 2023-04-06 DIAGNOSIS — Z794 Long term (current) use of insulin: Secondary | ICD-10-CM | POA: Diagnosis not present

## 2023-04-06 DIAGNOSIS — E538 Deficiency of other specified B group vitamins: Secondary | ICD-10-CM | POA: Diagnosis not present

## 2023-04-13 DIAGNOSIS — E038 Other specified hypothyroidism: Secondary | ICD-10-CM | POA: Diagnosis not present

## 2023-04-13 DIAGNOSIS — E063 Autoimmune thyroiditis: Secondary | ICD-10-CM | POA: Diagnosis not present

## 2023-04-13 DIAGNOSIS — Z794 Long term (current) use of insulin: Secondary | ICD-10-CM | POA: Diagnosis not present

## 2023-04-13 DIAGNOSIS — E113293 Type 2 diabetes mellitus with mild nonproliferative diabetic retinopathy without macular edema, bilateral: Secondary | ICD-10-CM | POA: Diagnosis not present

## 2023-04-13 DIAGNOSIS — E782 Mixed hyperlipidemia: Secondary | ICD-10-CM | POA: Diagnosis not present

## 2023-05-10 ENCOUNTER — Other Ambulatory Visit: Payer: Self-pay

## 2023-05-10 ENCOUNTER — Emergency Department: Payer: BC Managed Care – PPO

## 2023-05-10 ENCOUNTER — Emergency Department
Admission: EM | Admit: 2023-05-10 | Discharge: 2023-05-10 | Disposition: A | Discharge status: Home / Self Care | Payer: BC Managed Care – PPO | Attending: Emergency Medicine | Admitting: Emergency Medicine

## 2023-05-10 DIAGNOSIS — E119 Type 2 diabetes mellitus without complications: Secondary | ICD-10-CM | POA: Diagnosis not present

## 2023-05-10 DIAGNOSIS — E039 Hypothyroidism, unspecified: Secondary | ICD-10-CM | POA: Diagnosis not present

## 2023-05-10 DIAGNOSIS — R1013 Epigastric pain: Secondary | ICD-10-CM | POA: Diagnosis present

## 2023-05-10 DIAGNOSIS — R110 Nausea: Secondary | ICD-10-CM | POA: Insufficient documentation

## 2023-05-10 DIAGNOSIS — K529 Noninfective gastroenteritis and colitis, unspecified: Secondary | ICD-10-CM

## 2023-05-10 LAB — COMPREHENSIVE METABOLIC PANEL
ALT: 17 U/L (ref 0–44)
AST: 19 U/L (ref 15–41)
Albumin: 4.3 g/dL (ref 3.5–5.0)
Alkaline Phosphatase: 48 U/L (ref 38–126)
Anion gap: 10 (ref 5–15)
BUN: 12 mg/dL (ref 6–20)
CO2: 24 mmol/L (ref 22–32)
Calcium: 8.9 mg/dL (ref 8.9–10.3)
Chloride: 105 mmol/L (ref 98–111)
Creatinine, Ser: 0.73 mg/dL (ref 0.44–1.00)
GFR, Estimated: >60 mL/min (ref >60)
Glucose, Bld: 151 mg/dL — ABNORMAL HIGH (ref 70–99)
Potassium: 3.9 mmol/L (ref 3.5–5.1)
Sodium: 139 mmol/L (ref 135–145)
Total Bilirubin: 1 mg/dL (ref 0.3–1.2)
Total Protein: 7.3 g/dL (ref 6.5–8.1)

## 2023-05-10 LAB — LIPASE, BLOOD: Lipase: 32 U/L (ref 11–51)

## 2023-05-10 LAB — CBC WITH DIFFERENTIAL/PLATELET
Abs Immature Granulocytes: 0.03 10*3/uL (ref 0.00–0.07)
Basophils Absolute: 0 10*3/uL (ref 0.0–0.1)
Basophils Relative: 0 %
Eosinophils Absolute: 0.3 10*3/uL (ref 0.0–0.5)
Eosinophils Relative: 3 %
HCT: 44.2 % (ref 36.0–46.0)
Hemoglobin: 15 g/dL (ref 12.0–15.0)
Immature Granulocytes: 0 %
Lymphocytes Relative: 30 %
Lymphs Abs: 2.8 10*3/uL (ref 0.7–4.0)
MCH: 29.5 pg (ref 26.0–34.0)
MCHC: 33.9 g/dL (ref 30.0–36.0)
MCV: 86.8 fL (ref 80.0–100.0)
Monocytes Absolute: 0.5 10*3/uL (ref 0.1–1.0)
Monocytes Relative: 5 %
Neutro Abs: 5.6 10*3/uL (ref 1.7–7.7)
Neutrophils Relative %: 62 %
Platelets: 322 10*3/uL (ref 150–400)
RBC: 5.09 MIL/uL (ref 3.87–5.11)
RDW: 12.3 % (ref 11.5–15.5)
WBC: 9.2 10*3/uL (ref 4.0–10.5)
nRBC: 0 % (ref 0.0–0.2)

## 2023-05-10 LAB — TROPONIN I (HIGH SENSITIVITY)
Troponin I (High Sensitivity): 3 ng/L (ref <18)
Troponin I (High Sensitivity): 3 ng/L (ref <18)

## 2023-05-10 MED ORDER — HYDROMORPHONE HCL 1 MG/ML IJ SOLN
0.5000 mg | Freq: Once | INTRAMUSCULAR | Status: AC
  Administered 2023-05-10: 0.5 mg via INTRAVENOUS
  Filled 2023-05-10: qty 0.5

## 2023-05-10 MED ORDER — IOHEXOL 300 MG/ML  SOLN
100.0000 mL | Freq: Once | INTRAMUSCULAR | Status: AC | PRN
  Administered 2023-05-10: 100 mL via INTRAVENOUS

## 2023-05-10 MED ORDER — FENTANYL CITRATE PF 50 MCG/ML IJ SOSY
50.0000 ug | PREFILLED_SYRINGE | Freq: Once | INTRAMUSCULAR | Status: AC
  Administered 2023-05-10: 50 ug via INTRAVENOUS
  Filled 2023-05-10: qty 1

## 2023-05-10 MED ORDER — ONDANSETRON 4 MG PO TBDP: 4.0000 mg | ORAL_TABLET | Freq: Three times a day (TID) | ORAL | 0 refills | Status: DC | PRN

## 2023-05-10 MED ORDER — LIDOCAINE VISCOUS HCL 2 % MT SOLN
15.0000 mL | Freq: Once | OROMUCOSAL | Status: DC
  Filled 2023-05-10: qty 15

## 2023-05-10 MED ORDER — METOCLOPRAMIDE HCL 5 MG/ML IJ SOLN
5.0000 mg | Freq: Once | INTRAMUSCULAR | Status: AC
  Administered 2023-05-10: 5 mg via INTRAVENOUS
  Filled 2023-05-10: qty 2

## 2023-05-10 MED ORDER — FAMOTIDINE IN NACL 20-0.9 MG/50ML-% IV SOLN
20.0000 mg | Freq: Once | INTRAVENOUS | Status: AC
  Administered 2023-05-10: 20 mg via INTRAVENOUS
  Filled 2023-05-10: qty 50

## 2023-05-10 MED ORDER — ALUM & MAG HYDROXIDE-SIMETH 200-200-20 MG/5ML PO SUSP
30.0000 mL | Freq: Once | ORAL | Status: DC
  Filled 2023-05-10: qty 30

## 2023-05-10 MED ORDER — LACTATED RINGERS IV BOLUS
1000.0000 mL | Freq: Once | INTRAVENOUS | Status: AC
  Administered 2023-05-10: 1000 mL via INTRAVENOUS

## 2023-05-10 MED ORDER — SODIUM CHLORIDE 0.9 % IV BOLUS
1000.0000 mL | Freq: Once | INTRAVENOUS | Status: AC
  Administered 2023-05-10: 1000 mL via INTRAVENOUS

## 2023-05-10 MED ORDER — ONDANSETRON HCL 4 MG/2ML IJ SOLN
4.0000 mg | Freq: Once | INTRAMUSCULAR | Status: AC
  Administered 2023-05-10: 4 mg via INTRAVENOUS
  Filled 2023-05-10: qty 2

## 2023-05-10 NOTE — Discharge Instructions (Addendum)
You are seen in the emergency department for upper abdominal pain.  You had a CT scan that showed enterocolitis.  Your lab work was overall normal.  Your heart enzymes were normal, do not believe you are having a heart attack.  Increase your Pepcid from daily to twice daily.  Follow-up closely with your primary care physician.  Check your glucose frequently.  Stay hydrated and drink plenty of fluids.  Return for any worsening symptoms.  zofran (ondansetron) - nausea medication, take 1 tablet every 8 hours as needed for nausea/vomiting.

## 2023-05-10 NOTE — ED Provider Notes (Signed)
Care assumed of patient from outgoing provider.  See their note for initial history, exam and plan. Patient presented with not feeling well, history of diabetes, hypothyroidism and hyperlipidemia.  Prior cholecystectomy.  Epigastric abdominal pain associated with nausea and vomiting.  1 episode of diarrhea.  Nonbloody.  Labs reassuring.  Initial troponin negative.  No significant anemia.  Patient given multiple doses of pain medication, IV Pepcid  CT scan abdomen and pelvis with no acute findings.  Enterocolitis but no surgical etiology to explain the patient's pain.  Will obtain second troponin.  Given Reglan, possible component of gastroparesis.  If serial troponins negative and pain improves plan to discharge home with increasing dose of her Pepcid and follow-up with primary care physician.    Corena Herter, MD 05/10/23 1101

## 2023-05-10 NOTE — ED Provider Notes (Signed)
[facility] [address] Provider Note    Event Date/Time   First MD Initiated Contact with Patient 05/10/23 [phone]     (approximate)   History   Abdominal Pain   HPI  Ashley Fox is a 47 y.o. female who presents to the ED for evaluation of Abdominal Pain   I review endocrine visit from 1 month ago.  History of DM hypothyroidism, HLD.  Cholecystectomy  Patient presents to the ED for evaluation of nausea and epigastric pain.  Nausea started postprandially a couple days ago and was intermittent.  Pain started in the past 24 hours has been severe and persistent.  She has been awake since midnight and decided to come in due to the persistence of the discomfort.  Nausea associated with it.  No fevers.  No lower abdominal discomfort or urinary changes.  1 episode of watery stool but no persistent diarrhea or recent antibiotics.  No chest discomfort, dyspnea, cough  Physical Exam   Triage Vital Signs: ED Triage Vitals  Enc Vitals Group     BP 05/10/23 0514 (!) 150/85     Pulse Rate 05/10/23 0514 98     Resp 05/10/23 0514 17     Temp 05/10/23 0514 98 F (36.7 C)     Temp Source 05/10/23 0514 Oral     SpO2 05/10/23 0514 99 %     Weight 05/10/23 0515 156 lb (70.8 kg)     Height 05/10/23 0515 5\' 5"  (1.651 m)     Head Circumference --      Peak Flow --      Pain Score 05/10/23 0514 6     Pain Loc --      Pain Edu? --      Excl. in GC? --     Most recent vital signs: Vitals:   05/10/23 0514  BP: (!) 150/85  Pulse: 98  Resp: 17  Temp: 98 F (36.7 C)  SpO2: 99%    General: Awake, no distress.  Seems somewhat uncomfortable, but pleasant and conversational CV:  Good peripheral perfusion.  Resp:  Normal effort.  Abd:  No distention.  Epigastric tenderness, benign lower abdomen.  Tenderness extends somewhat to the RUQ more so than the LUQ MSK:  No deformity noted.  Neuro:  No focal deficits appreciated. Other:     ED Results / Procedures / Treatments    Labs (all labs ordered are listed, but only abnormal results are displayed) Labs Reviewed  COMPREHENSIVE METABOLIC PANEL - Abnormal; Notable for the following components:      Result Value   Glucose, Bld 151 (*)    All other components within normal limits  CBC WITH DIFFERENTIAL/PLATELET  LIPASE, BLOOD  URINALYSIS, ROUTINE W REFLEX MICROSCOPIC  POC URINE PREG, ED  TROPONIN I (HIGH SENSITIVITY)    EKG Sinus rhythm with a rate of 82 bpm.  Normal axis and intervals.  No clear signs of acute ischemia.  No comparison.  RADIOLOGY   Official radiology report(s): No results found.  PROCEDURES and INTERVENTIONS:  .1-3 Lead EKG Interpretation  Performed by: Delton Prairie, MD Authorized by: Delton Prairie, MD     Interpretation: normal     ECG rate:  94   ECG rate assessment: normal     Rhythm: sinus rhythm     Ectopy: none     Conduction: normal     Medications  lactated ringers bolus 1,000 mL (0 mLs Intravenous Stopped 05/10/23 0641)  ondansetron (ZOFRAN) injection 4  mg (4 mg Intravenous Given 05/10/23 0533)  HYDROmorphone (DILAUDID) injection 0.5 mg (0.5 mg Intravenous Given 05/10/23 0534)  famotidine (PEPCID) IVPB 20 mg premix (0 mg Intravenous Stopped 05/10/23 0700)  fentaNYL (SUBLIMAZE) injection 50 mcg (50 mcg Intravenous Given 05/10/23 0618)     IMPRESSION / MDM / ASSESSMENT AND PLAN / ED COURSE  I reviewed the triage vital signs and the nursing notes.  Differential diagnosis includes, but is not limited to, choledocholithiasis, pancreatitis, gastritis, ACS  {Patient presents with symptoms of an acute illness or injury that is potentially life-threatening.  Patient presents with acute epigastric pain and nausea.  EKG is reassuring and normal blood work with CBC lipase and CMP.  No evidence of biliary obstruction.  Awaiting cross-sectional imaging at the time of signout.  Clinical Course as of 05/10/23 0720  Wed May 10, 2023  0611 Reassessed. Still quite  uncomfortable. Discussed workup so far and plan of care. [DS]  1610 Epigastic pain  [SM]    Clinical Course User Index [DS] Delton Prairie, MD [SM] Corena Herter, MD     FINAL CLINICAL IMPRESSION(S) / ED DIAGNOSES   Final diagnoses:  Epigastric pain     Rx / DC Orders   ED Discharge Orders     None        Note:  This document was prepared using Dragon voice recognition software and may include unintentional dictation errors.   Delton Prairie, MD 05/10/23 [id number]

## 2023-05-10 NOTE — ED Triage Notes (Signed)
Pt presents to ER with c/o nausea and abd pain that started Monday afternoon after pt states she ate some green beans, mashed potatoes and [place] steak.  Pt states that since eating that meal, she has had epigastric area abd pain with radiation to her back, along with nausea, but no vomiting or diarrhea.  Pt endorses previous gall bladder removal.  Pt otherwise A&O x4 and in NAD on arrival.

## 2023-08-02 ENCOUNTER — Other Ambulatory Visit: Payer: Self-pay

## 2023-08-02 ENCOUNTER — Encounter: Payer: Self-pay | Admitting: Physician Assistant

## 2023-08-02 ENCOUNTER — Ambulatory Visit (INDEPENDENT_AMBULATORY_CARE_PROVIDER_SITE_OTHER): Payer: Self-pay | Admitting: Physician Assistant

## 2023-08-02 VITALS — BP 100/60 | HR 107 | Temp 97.7°F

## 2023-08-02 DIAGNOSIS — R051 Acute cough: Secondary | ICD-10-CM

## 2023-08-02 DIAGNOSIS — J9801 Acute bronchospasm: Secondary | ICD-10-CM

## 2023-08-02 MED ORDER — BENZONATATE 200 MG PO CAPS
200.0000 mg | ORAL_CAPSULE | Freq: Three times a day (TID) | ORAL | 0 refills | Status: DC | PRN
Start: 2023-08-02 — End: 2024-01-26

## 2023-08-02 MED ORDER — ALBUTEROL SULFATE HFA 108 (90 BASE) MCG/ACT IN AERS
2.0000 | INHALATION_SPRAY | Freq: Four times a day (QID) | RESPIRATORY_TRACT | 1 refills | Status: DC | PRN
Start: 2023-08-02 — End: 2024-01-26

## 2023-08-02 MED ORDER — PSEUDOEPH-BROMPHEN-DM 30-2-10 MG/5ML PO SYRP
10.0000 mL | ORAL_SOLUTION | Freq: Four times a day (QID) | ORAL | 0 refills | Status: DC | PRN
Start: 2023-08-02 — End: 2024-01-26

## 2023-08-02 NOTE — Progress Notes (Signed)
[facility] [address] [place], [place] [address]   Office Visit Note  Patient Name: Ashley Fox Date of Birth [id number]  Medical Record number [id number]  Date of Service: 08/02/2023  Chief Complaint  Patient presents with   Acute Visit    Patient states she began having body aches, chills, headache, and fatigue this past Friday. Her symptoms progressed and she developed a cough two days later which has since caused her to "lose her voice". She states she has not been sleeping due to the cough. She took a COVID test at home on Sunday evening which was negative. She has been taking Motrin, Aleve, and Mucinex to try to manager her symptoms.     47  y/o F presents to the clinic for c/o cough(worse at night), slight sore throat, fatigue, body aches which started 5 days ago. She continued to have her symptoms over the weekend. Tested negative for covid two days ago. Now complains of cough worse at night which has her stay up and unable to sleep. +h/o GERD, but takes Prilosec daily. Not taking any Ace Inhibitors. +h/o diabetes.       Current Medication:  Outpatient Encounter Medications as of 08/02/2023  Medication Sig   albuterol (VENTOLIN HFA) 108 (90 Base) MCG/ACT inhaler Inhale 2 puffs into the lungs every 6 (six) hours as needed for wheezing or shortness of breath.   benzonatate (TESSALON) 200 MG capsule Take 1 capsule (200 mg total) by mouth 3 (three) times daily as needed for cough.   brompheniramine-pseudoephedrine-DM 30-2-10 MG/5ML syrup Take 10 mLs by mouth 4 (four) times daily as needed.   empagliflozin (JARDIANCE) 25 MG TABS tablet Take by mouth daily.   ergocalciferol (VITAMIN D2) 1.25 MG (50000 UT) capsule Take 50,000 Units by mouth once a week.   escitalopram (LEXAPRO) 20 MG tablet Take 20 mg by mouth daily.   fenofibrate (TRICOR) 145 MG tablet Take 145 mg by mouth daily.   Insulin Aspart (NOVOLOG FLEXPEN [place]) Inject into the skin. Sliding scale   insulin  degludec (TRESIBA FLEXTOUCH) 200 UNIT/ML FlexTouch Pen Inject 60 Units into the skin daily.   Levocetirizine Dihydrochloride (XYZAL PO) Take 5 mg by mouth as needed.   levothyroxine (SYNTHROID) 88 MCG tablet Take 88 mcg by mouth daily before breakfast.   omeprazole (PRILOSEC) 20 MG capsule Take 20 mg by mouth daily.   ondansetron (ZOFRAN-ODT) 4 MG disintegrating tablet Take 1 tablet (4 mg total) by mouth every 8 (eight) hours as needed for nausea or vomiting.   rosuvastatin (CRESTOR) 10 MG tablet Take 10 mg by mouth daily.   tirzepatide (MOUNJARO) 10 MG/0.5ML Pen Inject 10 mg into the skin once a week.   Zolpidem Tartrate (AMBIEN PO) Take by mouth. PRN   Continuous Glucose Sensor (DEXCOM G7 SENSOR) MISC USE 1 EACH EVERY 10 (TEN) DAYS   No facility-administered encounter medications on file as of 08/02/2023.      Medical History: Past Medical History:  Diagnosis Date   Acid reflux    Anxiety    Hirsutism    Hypothyroid    PCOS (polycystic ovarian syndrome)    Sleep apnea    Type 1 diabetes ([facility])      Vital Signs: BP 100/60   Pulse (!) 107   Temp 97.7 F (36.5 C)   SpO2 98%    Review of Systems  Constitutional: Negative.   HENT:  Positive for sore throat. Negative for congestion, postnasal drip, sinus pressure, sinus pain and  trouble swallowing.   Respiratory:  Positive for cough. Negative for chest tightness, shortness of breath and wheezing.   Cardiovascular: Negative.   Neurological: Negative.     Physical Exam Constitutional:      Appearance: Normal appearance.  HENT:     Head: Atraumatic.     Right Ear: Tympanic membrane, ear canal and external ear normal.     Left Ear: Tympanic membrane, ear canal and external ear normal.     Nose: Nose normal.     Mouth/Throat:     Mouth: Mucous membranes are moist.     Pharynx: Oropharynx is clear.  Eyes:     Extraocular Movements: Extraocular movements intact.  Cardiovascular:     Rate and Rhythm: Normal rate and  regular rhythm.  Pulmonary:     Effort: Pulmonary effort is normal. No respiratory distress.     Breath sounds: Normal breath sounds. No stridor. No wheezing, rhonchi or rales.  Musculoskeletal:     Cervical back: Neck supple.  Skin:    General: Skin is warm.  Neurological:     Mental Status: She is alert.  Psychiatric:        Mood and Affect: Mood normal.        Behavior: Behavior normal.        Thought Content: Thought content normal.        Judgment: Judgment normal.       Assessment/Plan:  1. Acute cough - benzonatate (TESSALON) 200 MG capsule; Take 1 capsule (200 mg total) by mouth 3 (three) times daily as needed for cough.  Dispense: 30 capsule; Refill: 0 - brompheniramine-pseudoephedrine-DM 30-2-10 MG/5ML syrup; Take 10 mLs by mouth 4 (four) times daily as needed.  Dispense: 473 mL; Refill: 0  2. Bronchospasm - albuterol (VENTOLIN HFA) 108 (90 Base) MCG/ACT inhaler; Inhale 2 puffs into the lungs every 6 (six) hours as needed for wheezing or shortness of breath.  Dispense: 8 g; Refill: 1 - benzonatate (TESSALON) 200 MG capsule; Take 1 capsule (200 mg total) by mouth 3 (three) times daily as needed for cough.  Dispense: 30 capsule; Refill: 0  Increase fluids Take medicines as prescribed Continue to watch for worsening symptoms. Pt has h/o DM, will defer oral steroids. Pt will f/u if symptoms don't improve or worsen. Pt verbalized understanding and in agreement.    General Counseling: terrance dorf understanding of the findings of todays visit and agrees with plan of treatment. I have discussed any further diagnostic evaluation that may be needed or ordered today. We also reviewed her medications today. she has been encouraged to call the office with any questions or concerns that should arise related to todays visit.    Time spent:20 Minutes    Gilberto Better, [place] Physician Assistant

## 2023-08-03 ENCOUNTER — Telehealth: Payer: Self-pay | Admitting: Physician Assistant

## 2023-08-03 NOTE — Telephone Encounter (Signed)
Called patient twice to f/u for cough. No answer. Unable to leave a message on voicemail.

## 2023-08-08 DIAGNOSIS — Z794 Long term (current) use of insulin: Secondary | ICD-10-CM | POA: Diagnosis not present

## 2023-08-08 DIAGNOSIS — E038 Other specified hypothyroidism: Secondary | ICD-10-CM | POA: Diagnosis not present

## 2023-08-08 DIAGNOSIS — E063 Autoimmune thyroiditis: Secondary | ICD-10-CM | POA: Diagnosis not present

## 2023-08-08 DIAGNOSIS — E113293 Type 2 diabetes mellitus with mild nonproliferative diabetic retinopathy without macular edema, bilateral: Secondary | ICD-10-CM | POA: Diagnosis not present

## 2023-08-08 DIAGNOSIS — E782 Mixed hyperlipidemia: Secondary | ICD-10-CM | POA: Diagnosis not present

## 2023-08-15 DIAGNOSIS — E038 Other specified hypothyroidism: Secondary | ICD-10-CM | POA: Diagnosis not present

## 2023-08-15 DIAGNOSIS — E782 Mixed hyperlipidemia: Secondary | ICD-10-CM | POA: Diagnosis not present

## 2023-08-15 DIAGNOSIS — Z794 Long term (current) use of insulin: Secondary | ICD-10-CM | POA: Diagnosis not present

## 2023-08-15 DIAGNOSIS — E113293 Type 2 diabetes mellitus with mild nonproliferative diabetic retinopathy without macular edema, bilateral: Secondary | ICD-10-CM | POA: Diagnosis not present

## 2023-08-15 DIAGNOSIS — E063 Autoimmune thyroiditis: Secondary | ICD-10-CM | POA: Diagnosis not present

## 2023-09-08 DIAGNOSIS — G8929 Other chronic pain: Secondary | ICD-10-CM | POA: Diagnosis not present

## 2023-09-08 DIAGNOSIS — M25512 Pain in left shoulder: Secondary | ICD-10-CM | POA: Diagnosis not present

## 2023-09-08 DIAGNOSIS — M67912 Unspecified disorder of synovium and tendon, left shoulder: Secondary | ICD-10-CM | POA: Diagnosis not present

## 2023-10-03 DIAGNOSIS — Z87898 Personal history of other specified conditions: Secondary | ICD-10-CM | POA: Diagnosis not present

## 2023-10-03 DIAGNOSIS — Z7184 Encounter for health counseling related to travel: Secondary | ICD-10-CM | POA: Diagnosis not present

## 2023-10-17 DIAGNOSIS — M7522 Bicipital tendinitis, left shoulder: Secondary | ICD-10-CM | POA: Diagnosis not present

## 2023-10-30 DIAGNOSIS — M6281 Muscle weakness (generalized): Secondary | ICD-10-CM | POA: Diagnosis not present

## 2023-10-30 DIAGNOSIS — G8929 Other chronic pain: Secondary | ICD-10-CM | POA: Diagnosis not present

## 2023-10-30 DIAGNOSIS — M25512 Pain in left shoulder: Secondary | ICD-10-CM | POA: Diagnosis not present

## 2023-10-30 DIAGNOSIS — M25612 Stiffness of left shoulder, not elsewhere classified: Secondary | ICD-10-CM | POA: Diagnosis not present

## 2023-11-01 ENCOUNTER — Other Ambulatory Visit: Payer: Self-pay | Admitting: Student

## 2023-11-01 DIAGNOSIS — M7522 Bicipital tendinitis, left shoulder: Secondary | ICD-10-CM

## 2023-11-01 DIAGNOSIS — M67912 Unspecified disorder of synovium and tendon, left shoulder: Secondary | ICD-10-CM

## 2023-11-06 DIAGNOSIS — M25512 Pain in left shoulder: Secondary | ICD-10-CM | POA: Diagnosis not present

## 2023-11-06 DIAGNOSIS — G8929 Other chronic pain: Secondary | ICD-10-CM | POA: Diagnosis not present

## 2023-11-10 ENCOUNTER — Encounter: Payer: Self-pay | Admitting: Student

## 2023-11-16 ENCOUNTER — Ambulatory Visit
Admission: RE | Admit: 2023-11-16 | Discharge: 2023-11-16 | Disposition: A | Discharge status: Home / Self Care | Payer: BC Managed Care – PPO | Source: Ambulatory Visit | Attending: Student | Admitting: Student

## 2023-11-16 DIAGNOSIS — M7522 Bicipital tendinitis, left shoulder: Secondary | ICD-10-CM

## 2023-11-16 DIAGNOSIS — M67912 Unspecified disorder of synovium and tendon, left shoulder: Secondary | ICD-10-CM

## 2023-12-12 DIAGNOSIS — M7522 Bicipital tendinitis, left shoulder: Secondary | ICD-10-CM | POA: Diagnosis not present

## 2023-12-12 DIAGNOSIS — M75112 Incomplete rotator cuff tear or rupture of left shoulder, not specified as traumatic: Secondary | ICD-10-CM | POA: Diagnosis not present

## 2023-12-12 DIAGNOSIS — M67912 Unspecified disorder of synovium and tendon, left shoulder: Secondary | ICD-10-CM | POA: Diagnosis not present

## 2023-12-12 DIAGNOSIS — M25512 Pain in left shoulder: Secondary | ICD-10-CM | POA: Diagnosis not present

## 2023-12-14 DIAGNOSIS — Z794 Long term (current) use of insulin: Secondary | ICD-10-CM | POA: Diagnosis not present

## 2023-12-14 DIAGNOSIS — E113293 Type 2 diabetes mellitus with mild nonproliferative diabetic retinopathy without macular edema, bilateral: Secondary | ICD-10-CM | POA: Diagnosis not present

## 2023-12-21 DIAGNOSIS — E063 Autoimmune thyroiditis: Secondary | ICD-10-CM | POA: Diagnosis not present

## 2023-12-21 DIAGNOSIS — E113293 Type 2 diabetes mellitus with mild nonproliferative diabetic retinopathy without macular edema, bilateral: Secondary | ICD-10-CM | POA: Diagnosis not present

## 2023-12-21 DIAGNOSIS — Z794 Long term (current) use of insulin: Secondary | ICD-10-CM | POA: Diagnosis not present

## 2023-12-21 DIAGNOSIS — E782 Mixed hyperlipidemia: Secondary | ICD-10-CM | POA: Diagnosis not present

## 2024-01-05 DIAGNOSIS — M7522 Bicipital tendinitis, left shoulder: Secondary | ICD-10-CM | POA: Diagnosis not present

## 2024-01-05 DIAGNOSIS — G8929 Other chronic pain: Secondary | ICD-10-CM | POA: Diagnosis not present

## 2024-01-05 DIAGNOSIS — M75112 Incomplete rotator cuff tear or rupture of left shoulder, not specified as traumatic: Secondary | ICD-10-CM | POA: Diagnosis not present

## 2024-01-05 DIAGNOSIS — M25512 Pain in left shoulder: Secondary | ICD-10-CM | POA: Diagnosis not present

## 2024-01-05 DIAGNOSIS — M67912 Unspecified disorder of synovium and tendon, left shoulder: Secondary | ICD-10-CM | POA: Diagnosis not present

## 2024-01-05 DIAGNOSIS — M25812 Other specified joint disorders, left shoulder: Secondary | ICD-10-CM | POA: Diagnosis not present

## 2024-01-26 ENCOUNTER — Other Ambulatory Visit: Payer: Self-pay | Admitting: Surgery

## 2024-01-30 ENCOUNTER — Encounter
Admission: RE | Admit: 2024-01-30 | Discharge: 2024-01-30 | Disposition: A | Discharge status: Home / Self Care | Source: Ambulatory Visit | Attending: Surgery | Admitting: Surgery

## 2024-01-30 ENCOUNTER — Inpatient Hospital Stay
Admission: RE | Admit: 2024-01-30 | Discharge: 2024-01-30 | Disposition: A | Discharge status: Home / Self Care | Source: Ambulatory Visit

## 2024-01-30 ENCOUNTER — Other Ambulatory Visit: Payer: Self-pay

## 2024-01-30 DIAGNOSIS — Z794 Long term (current) use of insulin: Secondary | ICD-10-CM | POA: Diagnosis not present

## 2024-01-30 DIAGNOSIS — M7522 Bicipital tendinitis, left shoulder: Secondary | ICD-10-CM | POA: Diagnosis not present

## 2024-01-30 DIAGNOSIS — E119 Type 2 diabetes mellitus without complications: Secondary | ICD-10-CM | POA: Diagnosis not present

## 2024-01-30 DIAGNOSIS — G8929 Other chronic pain: Secondary | ICD-10-CM | POA: Diagnosis not present

## 2024-01-30 DIAGNOSIS — Z01810 Encounter for preprocedural cardiovascular examination: Secondary | ICD-10-CM | POA: Insufficient documentation

## 2024-01-30 DIAGNOSIS — M67912 Unspecified disorder of synovium and tendon, left shoulder: Secondary | ICD-10-CM | POA: Diagnosis not present

## 2024-01-30 DIAGNOSIS — M75112 Incomplete rotator cuff tear or rupture of left shoulder, not specified as traumatic: Secondary | ICD-10-CM | POA: Diagnosis not present

## 2024-01-30 DIAGNOSIS — Z01818 Encounter for other preprocedural examination: Secondary | ICD-10-CM | POA: Diagnosis present

## 2024-01-30 DIAGNOSIS — M25812 Other specified joint disorders, left shoulder: Secondary | ICD-10-CM | POA: Diagnosis not present

## 2024-01-30 DIAGNOSIS — M25512 Pain in left shoulder: Secondary | ICD-10-CM | POA: Diagnosis not present

## 2024-01-30 DIAGNOSIS — Z01812 Encounter for preprocedural laboratory examination: Secondary | ICD-10-CM

## 2024-01-30 HISTORY — DX: Mixed hyperlipidemia: E78.2

## 2024-01-30 HISTORY — DX: Autoimmune thyroiditis: E06.3

## 2024-01-30 HISTORY — DX: Diverticulosis of intestine, part unspecified, without perforation or abscess without bleeding: K57.90

## 2024-01-30 HISTORY — DX: Carpal tunnel syndrome, bilateral upper limbs: G56.03

## 2024-01-30 HISTORY — DX: Primary central sleep apnea: G47.31

## 2024-01-30 HISTORY — DX: Migraine, unspecified, not intractable, without status migrainosus: G43.909

## 2024-01-30 NOTE — Patient Instructions (Signed)
 Your procedure is scheduled on: Tuesday, March 18 Report to the Registration Desk on the 1st floor of the [facility] To find out your arrival time, please call [phone] between 1PM - 3PM on: Monday, March 17 If your arrival time is 6:00 am, do not arrive before that time as the [facility] entrance doors do not open until 6:00 am.  REMEMBER: Instructions that are not followed completely may result in serious medical risk, up to and including death; or upon the discretion of your surgeon and anesthesiologist your surgery may need to be rescheduled.  Do not eat food after midnight the night before surgery.  No gum chewing or hard candies.  You may however, drink water up to 2 hours before you are scheduled to arrive for your surgery. Do not drink anything within 2 hours of your scheduled arrival time.  In addition, your doctor has ordered for you to drink the provided:  Gatorade G2 Drinking this carbohydrate drink up to two hours before surgery helps to reduce insulin resistance and improve patient outcomes. Please complete drinking 2 hours before scheduled arrival time.  One week prior to surgery: starting March 11 Stop meloxicam and Anti-inflammatories (NSAIDS) such as Advil, Aleve, Ibuprofen, Motrin, Naproxen, Naprosyn and Aspirin based products such as Excedrin, Goody's Powder, BC Powder. Stop ANY OVER THE COUNTER supplements until after surgery.  You may however, continue to take Tylenol if needed for pain up until the day of surgery.  Jardiance - hold for 3 days before surgery. Last day to take is Friday, March 14. Resume AFTER surgery.  tirzepatide [facility]) - hold for 7 days before surgery. Do NOT take doseon Sunday, March 16. Resume AFTER surgery on your regular weekly day, Sunday March 23.  Continue taking all of your other prescription medications up until the day of surgery.  Evaristo Bury - only take half of your regular dose the night before surgery. Only take 25 units  on Monday night.   Do NOT take ANY insulin on the morning of surgery.  ON THE DAY OF SURGERY ONLY TAKE THESE MEDICATIONS WITH SIPS OF WATER:  escitalopram (LEXAPRO) levothyroxine (SYNTHROID)  omeprazole (PRILOSEC)  HYDROcodone only if needed for pain  No Alcohol for 24 hours before or after surgery.  No Smoking including e-cigarettes for 24 hours before surgery.  No chewable tobacco products for at least 6 hours before surgery.  No nicotine patches on the day of surgery.  Do not use any "recreational" drugs for at least a week (preferably 2 weeks) before your surgery.  Please be advised that the combination of cocaine and anesthesia may have negative outcomes, up to and including death. If you test positive for cocaine, your surgery will be cancelled.  On the morning of surgery brush your teeth with toothpaste and water, you may rinse your mouth with mouthwash if you wish. Do not swallow any toothpaste or mouthwash.  Use CHG Soap as directed on instruction sheet.  Do not wear jewelry, make-up, hairpins, clips or nail polish. No deodorant.  Do not have the Dexcom on the left arm on the day of surgery since your surgery is on the left shoulder.  For welded (permanent) jewelry: bracelets, anklets, waist bands, etc.  Please have this removed prior to surgery.  If it is not removed, there is a chance that hospital personnel will need to cut it off on the day of surgery.  Do not wear lotions, powders, or perfumes.   Do not shave body hair  from the neck down 48 hours before surgery.  Contact lenses, hearing aids and dentures may not be worn into surgery.  Do not bring valuables to the hospital. [facility] is not responsible for any missing/lost belongings or valuables.   Bring your C-PAP to the hospital in case you may have to spend the night.   Notify your doctor if there is any change in your medical condition (cold, fever, infection).  Wear comfortable clothing (specific to  your surgery type) to the hospital.  After surgery, you can help prevent lung complications by doing breathing exercises.  Take deep breaths and cough every 1-2 hours. Your doctor may order a device called an Incentive Spirometer to help you take deep breaths.  If you are being discharged the day of surgery, you will not be allowed to drive home. You will need a responsible individual to drive you home and stay with you for 24 hours after surgery.   If you are taking public transportation, you will need to have a responsible individual with you.  Please call the Pre-admissions Testing Dept. at [phone] if you have any questions about these instructions.  Surgery Visitation Policy:  Patients having surgery or a procedure may have two visitors.  Children under the age of 69 must have an adult with them who is not the patient.  Temporary Visitor Restrictions Due to increasing cases of flu, RSV and COVID-19: Children ages 34 and under will not be able to visit patients in [facility] hospitals under most circumstances.     Preparing for Surgery with CHLORHEXIDINE GLUCONATE (CHG) Soap  Chlorhexidine Gluconate (CHG) Soap  o An antiseptic cleaner that kills germs and bonds with the skin to continue killing germs even after washing  o Used for showering the night before surgery and morning of surgery  Before surgery, you can play an important role by reducing the number of germs on your skin.  CHG (Chlorhexidine gluconate) soap is an antiseptic cleanser which kills germs and bonds with the skin to continue killing germs even after washing.  Please do not use if you have an allergy to CHG or antibacterial soaps. If your skin becomes reddened/irritated stop using the CHG.  1. Shower the NIGHT BEFORE SURGERY and the MORNING OF SURGERY with CHG soap.  2. If you choose to wash your hair, wash your hair first as usual with your normal shampoo.  3. After shampooing, rinse your hair  and body thoroughly to remove the shampoo.  4. Use CHG as you would any other liquid soap. You can apply CHG directly to the skin and wash gently with a scrungie or a clean washcloth.  5. Apply the CHG soap to your body only from the neck down. Do not use on open wounds or open sores. Avoid contact with your eyes, ears, mouth, and genitals (private parts). Wash face and genitals (private parts) with your normal soap.  6. Wash thoroughly, paying special attention to the area where your surgery will be performed.  7. Thoroughly rinse your body with warm water.  8. Do not shower/wash with your normal soap after using and rinsing off the CHG soap.  9. Pat yourself dry with a clean towel.  10. Wear clean pajamas to bed the night before surgery.  12. Place clean sheets on your bed the night of your first shower and do not sleep with pets.  13. Shower again with the CHG soap on the day of surgery prior to arriving at  the hospital.  14. Do not apply any deodorants/lotions/powders.  15. Please wear clean clothes to the hospital.

## 2024-02-06 ENCOUNTER — Ambulatory Visit: Payer: Self-pay

## 2024-02-06 ENCOUNTER — Other Ambulatory Visit: Payer: Self-pay

## 2024-02-06 ENCOUNTER — Encounter: Payer: Self-pay | Admitting: Surgery

## 2024-02-06 ENCOUNTER — Encounter
Admission: RE | Disposition: A | Discharge status: Home / Self Care | Payer: Self-pay | Source: Home / Self Care | Attending: Surgery

## 2024-02-06 ENCOUNTER — Ambulatory Visit
Admission: RE | Admit: 2024-02-06 | Discharge: 2024-02-06 | Disposition: A | Discharge status: Home / Self Care | Attending: Surgery | Admitting: Surgery

## 2024-02-06 ENCOUNTER — Ambulatory Visit

## 2024-02-06 DIAGNOSIS — F419 Anxiety disorder, unspecified: Secondary | ICD-10-CM | POA: Diagnosis not present

## 2024-02-06 DIAGNOSIS — Z794 Long term (current) use of insulin: Secondary | ICD-10-CM | POA: Diagnosis not present

## 2024-02-06 DIAGNOSIS — M67912 Unspecified disorder of synovium and tendon, left shoulder: Secondary | ICD-10-CM | POA: Diagnosis not present

## 2024-02-06 DIAGNOSIS — M25812 Other specified joint disorders, left shoulder: Secondary | ICD-10-CM | POA: Diagnosis not present

## 2024-02-06 DIAGNOSIS — Z7984 Long term (current) use of oral hypoglycemic drugs: Secondary | ICD-10-CM | POA: Insufficient documentation

## 2024-02-06 DIAGNOSIS — E039 Hypothyroidism, unspecified: Secondary | ICD-10-CM | POA: Insufficient documentation

## 2024-02-06 DIAGNOSIS — M7522 Bicipital tendinitis, left shoulder: Secondary | ICD-10-CM | POA: Diagnosis not present

## 2024-02-06 DIAGNOSIS — G8918 Other acute postprocedural pain: Secondary | ICD-10-CM | POA: Diagnosis not present

## 2024-02-06 DIAGNOSIS — S43432A Superior glenoid labrum lesion of left shoulder, initial encounter: Secondary | ICD-10-CM | POA: Diagnosis not present

## 2024-02-06 DIAGNOSIS — M7582 Other shoulder lesions, left shoulder: Secondary | ICD-10-CM | POA: Diagnosis not present

## 2024-02-06 DIAGNOSIS — E119 Type 2 diabetes mellitus without complications: Secondary | ICD-10-CM | POA: Diagnosis not present

## 2024-02-06 DIAGNOSIS — M75112 Incomplete rotator cuff tear or rupture of left shoulder, not specified as traumatic: Secondary | ICD-10-CM | POA: Diagnosis not present

## 2024-02-06 DIAGNOSIS — K219 Gastro-esophageal reflux disease without esophagitis: Secondary | ICD-10-CM | POA: Diagnosis not present

## 2024-02-06 DIAGNOSIS — X58XXXA Exposure to other specified factors, initial encounter: Secondary | ICD-10-CM | POA: Diagnosis not present

## 2024-02-06 DIAGNOSIS — Z7985 Long-term (current) use of injectable non-insulin antidiabetic drugs: Secondary | ICD-10-CM | POA: Diagnosis not present

## 2024-02-06 DIAGNOSIS — Z01812 Encounter for preprocedural laboratory examination: Secondary | ICD-10-CM

## 2024-02-06 DIAGNOSIS — Z01810 Encounter for preprocedural cardiovascular examination: Secondary | ICD-10-CM

## 2024-02-06 HISTORY — PX: POSTERIOR LUMBAR FUSION 2 WITH HARDWARE REMOVAL: SHX7297

## 2024-02-06 LAB — GLUCOSE, CAPILLARY
Glucose-Capillary: 106 mg/dL — ABNORMAL HIGH (ref 70–99)
Glucose-Capillary: 111 mg/dL — ABNORMAL HIGH (ref 70–99)

## 2024-02-06 LAB — POCT PREGNANCY, URINE: Preg Test, Ur: NEGATIVE

## 2024-02-06 SURGERY — ARTHROSCOPY, SHOULDER WITH DEBRIDEMENT
Anesthesia: General | Site: Shoulder | Laterality: Left

## 2024-02-06 MED ORDER — ACETAMINOPHEN 10 MG/ML IV SOLN
INTRAVENOUS | Status: DC | PRN
  Administered 2024-02-06: 1000 mg via INTRAVENOUS

## 2024-02-06 MED ORDER — ACETAMINOPHEN 10 MG/ML IV SOLN
INTRAVENOUS | Status: AC
  Filled 2024-02-06: qty 100

## 2024-02-06 MED ORDER — LIDOCAINE HCL (PF) 2 % IJ SOLN
INTRAMUSCULAR | Status: AC
  Filled 2024-02-06: qty 5

## 2024-02-06 MED ORDER — MIDAZOLAM HCL 2 MG/2ML IJ SOLN
1.0000 mg | INTRAMUSCULAR | Status: AC | PRN
  Administered 2024-02-06 (×2): 1 mg via INTRAVENOUS

## 2024-02-06 MED ORDER — ONDANSETRON HCL 4 MG/2ML IJ SOLN
INTRAMUSCULAR | Status: AC
  Filled 2024-02-06: qty 2

## 2024-02-06 MED ORDER — CEFAZOLIN SODIUM-DEXTROSE 2-4 GM/100ML-% IV SOLN
INTRAVENOUS | Status: AC
  Filled 2024-02-06: qty 100

## 2024-02-06 MED ORDER — PROPOFOL 10 MG/ML IV BOLUS
INTRAVENOUS | Status: DC | PRN
  Administered 2024-02-06: 100 mg via INTRAVENOUS

## 2024-02-06 MED ORDER — FENTANYL CITRATE (PF) 100 MCG/2ML IJ SOLN: 25.0000 ug | INTRAMUSCULAR | Status: DC | PRN

## 2024-02-06 MED ORDER — ONDANSETRON HCL 4 MG/2ML IJ SOLN
INTRAMUSCULAR | Status: DC | PRN
  Administered 2024-02-06: 4 mg via INTRAVENOUS

## 2024-02-06 MED ORDER — DEXAMETHASONE SODIUM PHOSPHATE 10 MG/ML IJ SOLN
INTRAMUSCULAR | Status: DC | PRN
  Administered 2024-02-06: 5 mg via INTRAVENOUS

## 2024-02-06 MED ORDER — ORAL CARE MOUTH RINSE: 15.0000 mL | Freq: Once | OROMUCOSAL | Status: AC

## 2024-02-06 MED ORDER — ROCURONIUM BROMIDE 10 MG/ML (PF) SYRINGE
PREFILLED_SYRINGE | INTRAVENOUS | Status: AC
  Filled 2024-02-06: qty 10

## 2024-02-06 MED ORDER — PHENYLEPHRINE HCL-NACL 20-0.9 MG/250ML-% IV SOLN
INTRAVENOUS | Status: AC
  Filled 2024-02-06: qty 250

## 2024-02-06 MED ORDER — OXYCODONE HCL 5 MG PO TABS: 5.0000 mg | ORAL_TABLET | ORAL | 0 refills | Status: DC | PRN

## 2024-02-06 MED ORDER — BUPIVACAINE HCL (PF) 0.5 % IJ SOLN
INTRAMUSCULAR | Status: DC | PRN
  Administered 2024-02-06: 10 mL via PERINEURAL

## 2024-02-06 MED ORDER — ACETAMINOPHEN 10 MG/ML IV SOLN: 1000.0000 mg | Freq: Once | INTRAVENOUS | Status: DC | PRN

## 2024-02-06 MED ORDER — FENTANYL CITRATE (PF) 100 MCG/2ML IJ SOLN
INTRAMUSCULAR | Status: AC
  Filled 2024-02-06: qty 2

## 2024-02-06 MED ORDER — CHLORHEXIDINE GLUCONATE 0.12 % MT SOLN
15.0000 mL | Freq: Once | OROMUCOSAL | Status: AC
  Administered 2024-02-06: 15 mL via OROMUCOSAL

## 2024-02-06 MED ORDER — EPINEPHRINE PF 1 MG/ML IJ SOLN
INTRAMUSCULAR | Status: AC
  Filled 2024-02-06: qty 1

## 2024-02-06 MED ORDER — ROCURONIUM BROMIDE 100 MG/10ML IV SOLN
INTRAVENOUS | Status: DC | PRN
  Administered 2024-02-06: 50 mg via INTRAVENOUS
  Administered 2024-02-06: 10 mg via INTRAVENOUS

## 2024-02-06 MED ORDER — OXYCODONE HCL 5 MG PO TABS: 5.0000 mg | ORAL_TABLET | Freq: Once | ORAL | Status: DC | PRN

## 2024-02-06 MED ORDER — OXYCODONE HCL 5 MG/5ML PO SOLN: 5.0000 mg | Freq: Once | ORAL | Status: DC | PRN

## 2024-02-06 MED ORDER — BUPIVACAINE-EPINEPHRINE 0.5% -1:200000 IJ SOLN
INTRAMUSCULAR | Status: DC | PRN
  Administered 2024-02-06: 30 mL

## 2024-02-06 MED ORDER — MIDAZOLAM HCL 2 MG/2ML IJ SOLN
INTRAMUSCULAR | Status: AC
  Filled 2024-02-06: qty 2

## 2024-02-06 MED ORDER — SODIUM CHLORIDE 0.9 % IV SOLN: INTRAVENOUS | Status: DC

## 2024-02-06 MED ORDER — CHLORHEXIDINE GLUCONATE 0.12 % MT SOLN
OROMUCOSAL | Status: AC
  Filled 2024-02-06: qty 15

## 2024-02-06 MED ORDER — KETOROLAC TROMETHAMINE 30 MG/ML IJ SOLN
INTRAMUSCULAR | Status: AC
  Filled 2024-02-06: qty 1

## 2024-02-06 MED ORDER — BUPIVACAINE LIPOSOME 1.3 % IJ SUSP
INTRAMUSCULAR | Status: DC | PRN
Start: 2024-02-06 — End: 2024-02-06
  Administered 2024-02-06: 20 mL via PERINEURAL

## 2024-02-06 MED ORDER — KETOROLAC TROMETHAMINE 30 MG/ML IJ SOLN
30.0000 mg | Freq: Once | INTRAMUSCULAR | Status: AC
Start: 2024-02-06 — End: 2024-02-06
  Administered 2024-02-06: 30 mg via INTRAVENOUS

## 2024-02-06 MED ORDER — LACTATED RINGERS IV SOLN
INTRAVENOUS | Status: DC | PRN
  Administered 2024-02-06: 30000 mL

## 2024-02-06 MED ORDER — SUGAMMADEX SODIUM 200 MG/2ML IV SOLN
INTRAVENOUS | Status: DC | PRN
  Administered 2024-02-06: 200 mg via INTRAVENOUS

## 2024-02-06 MED ORDER — PROPOFOL 10 MG/ML IV BOLUS
INTRAVENOUS | Status: AC
  Filled 2024-02-06: qty 20

## 2024-02-06 MED ORDER — BUPIVACAINE-EPINEPHRINE (PF) 0.5% -1:200000 IJ SOLN
INTRAMUSCULAR | Status: AC
  Filled 2024-02-06: qty 10

## 2024-02-06 MED ORDER — DEXAMETHASONE SODIUM PHOSPHATE 10 MG/ML IJ SOLN
INTRAMUSCULAR | Status: AC
  Filled 2024-02-06: qty 1

## 2024-02-06 MED ORDER — RINGERS IRRIGATION IR SOLN: Status: DC | PRN

## 2024-02-06 MED ORDER — DROPERIDOL 2.5 MG/ML IJ SOLN: 0.6250 mg | Freq: Once | INTRAMUSCULAR | Status: DC | PRN

## 2024-02-06 MED ORDER — CEFAZOLIN SODIUM-DEXTROSE 2-4 GM/100ML-% IV SOLN
2.0000 g | INTRAVENOUS | Status: AC
  Administered 2024-02-06: 2 g via INTRAVENOUS

## 2024-02-06 MED ORDER — LIDOCAINE HCL (PF) 1 % IJ SOLN
INTRAMUSCULAR | Status: DC | PRN
Start: 2024-02-06 — End: 2024-02-06
  Administered 2024-02-06: 2 mL via SUBCUTANEOUS

## 2024-02-06 MED ORDER — PHENYLEPHRINE HCL-NACL 20-0.9 MG/250ML-% IV SOLN
INTRAVENOUS | Status: DC | PRN
  Administered 2024-02-06: 40 ug/min via INTRAVENOUS

## 2024-02-06 MED ORDER — FENTANYL CITRATE (PF) 100 MCG/2ML IJ SOLN
INTRAMUSCULAR | Status: DC | PRN
Start: 2024-02-06 — End: 2024-02-06
  Administered 2024-02-06 (×2): 50 ug via INTRAVENOUS

## 2024-02-06 MED ORDER — BUPIVACAINE-EPINEPHRINE (PF) 0.5% -1:200000 IJ SOLN
INTRAMUSCULAR | Status: AC
  Filled 2024-02-06: qty 20

## 2024-02-06 MED ORDER — LIDOCAINE HCL (CARDIAC) PF 100 MG/5ML IV SOSY
PREFILLED_SYRINGE | INTRAVENOUS | Status: DC | PRN
  Administered 2024-02-06: 60 mg via INTRAVENOUS

## 2024-02-06 SURGICAL SUPPLY — 46 items
ANCHOR BONE REGENETEN (Anchor) IMPLANT
ANCHOR JUGGERKNOT WTAP NDL 2.9 (Anchor) IMPLANT
ANCHOR TENDON REGENETEN (Staple) IMPLANT
BIT DRILL JUGRKNT W/NDL BIT2.9 (DRILL) IMPLANT
BLADE FULL RADIUS 3.5 (BLADE) ×1 IMPLANT
BUR ACROMIONIZER 4.0 (BURR) ×1 IMPLANT
CHLORAPREP W/TINT 26 (MISCELLANEOUS) ×1 IMPLANT
COVER MAYO STAND STRL (DRAPES) ×1 IMPLANT
DILATOR 5.5 THREADED HEALICOIL (MISCELLANEOUS) IMPLANT
DRILL JUGGERKNOT W/NDL BIT 2.9 (DRILL) ×1 IMPLANT
ELECT CAUTERY BLADE 6.4 (BLADE) ×1 IMPLANT
ELECT REM PT RETURN 9FT ADLT (ELECTROSURGICAL) ×1 IMPLANT
ELECTRODE REM PT RTRN 9FT ADLT (ELECTROSURGICAL) ×1 IMPLANT
GAUZE SPONGE 4X4 12PLY STRL (GAUZE/BANDAGES/DRESSINGS) ×1 IMPLANT
GAUZE XEROFORM 1X8 LF (GAUZE/BANDAGES/DRESSINGS) ×1 IMPLANT
GLOVE BIO SURGEON STRL SZ7.5 (GLOVE) ×2 IMPLANT
GLOVE BIO SURGEON STRL SZ8 (GLOVE) ×2 IMPLANT
GLOVE BIOGEL PI IND STRL 8 (GLOVE) ×1 IMPLANT
GLOVE INDICATOR 8.0 STRL GRN (GLOVE) ×1 IMPLANT
GOWN STRL REUS W/ TWL LRG LVL3 (GOWN DISPOSABLE) ×1 IMPLANT
GOWN STRL REUS W/ TWL XL LVL3 (GOWN DISPOSABLE) ×1 IMPLANT
GRASPER SUT 15 45D LOW PRO (SUTURE) IMPLANT
IMPL REGENETEN MEDIUM (Shoulder) IMPLANT
IMPLANT REGENETEN MEDIUM (Shoulder) ×1 IMPLANT
IV LR IRRIG 3000ML ARTHROMATIC (IV SOLUTION) ×2 IMPLANT
KIT CANNULA 8X76-LX IN CANNULA (CANNULA) ×1 IMPLANT
MANIFOLD NEPTUNE II (INSTRUMENTS) ×1 IMPLANT
MASK FACE SPIDER DISP (MASK) ×1 IMPLANT
MAT ABSORB FLUID 56X50 GRAY (MISCELLANEOUS) ×1 IMPLANT
PACK ARTHROSCOPY SHOULDER (MISCELLANEOUS) ×1 IMPLANT
PAD ABD DERMACEA PRESS 5X9 (GAUZE/BANDAGES/DRESSINGS) ×2 IMPLANT
PASSER SUT FIRSTPASS SELF (INSTRUMENTS) IMPLANT
SLING ULTRA II LG (MISCELLANEOUS) ×1 IMPLANT
SPONGE T-LAP 18X18 ~~LOC~~+RFID (SPONGE) ×1 IMPLANT
STAPLER SKIN PROX 35W (STAPLE) ×1 IMPLANT
STRAP SAFETY 5IN WIDE (MISCELLANEOUS) ×1 IMPLANT
SUT ETHIBOND 0 MO6 C/R (SUTURE) ×1 IMPLANT
SUT ULTRABRAID 2 COBRAID 38 (SUTURE) IMPLANT
SUT VIC AB 2-0 CT1 (SUTURE) IMPLANT
SUT VIC AB 2-0 CT1 TAPERPNT 27 (SUTURE) ×2 IMPLANT
TAPE MICROFOAM 4IN (TAPE) ×1 IMPLANT
TRAP FLUID SMOKE EVACUATOR (MISCELLANEOUS) ×1 IMPLANT
TUBE SET DOUBLEFLO INFLOW (TUBING) ×1 IMPLANT
TUBING CONNECTING 10 (TUBING) ×1 IMPLANT
WAND WEREWOLF FLOW 90D (MISCELLANEOUS) ×1 IMPLANT
WATER STERILE IRR 500ML POUR (IV SOLUTION) ×1 IMPLANT

## 2024-02-06 NOTE — Anesthesia Preprocedure Evaluation (Signed)
 Anesthesia Evaluation  Patient identified by MRN, date of birth, ID band Patient awake    Reviewed: Allergy & Precautions, H&P , NPO status , Patient's Chart, lab work & pertinent test results, reviewed documented beta blocker date and time   Airway Mallampati: II  TM Distance: >3 FB Neck ROM: full    Dental  (+) Teeth Intact   Pulmonary neg pulmonary ROS   Pulmonary exam normal        Cardiovascular Exercise Tolerance: Good negative cardio ROS Normal cardiovascular exam Rhythm:regular Rate:Normal     Neuro/Psych  Headaches  Anxiety      Neuromuscular disease  negative psych ROS   GI/Hepatic Neg liver ROS,GERD  Medicated,,  Endo/Other  diabetes, Well ControlledHypothyroidism    Renal/GU negative Renal ROS  negative genitourinary   Musculoskeletal   Abdominal   Peds  Hematology negative hematology ROS (+)   Anesthesia Other Findings Past Medical History: No date: Acid reflux No date: Anxiety No date: Carpal tunnel syndrome, bilateral No date: Central sleep apnea     Comment:  c-pap No date: Diverticulosis No date: Hirsutism No date: Hypothyroid No date: Hypothyroidism due to Hashimoto's thyroiditis No date: Migraines No date: Mixed hyperlipidemia No date: PCOS (polycystic ovarian syndrome) No date: Type 1 diabetes (HCC) Past Surgical History: 2021: BREAST BIOPSY; Right     Comment:  stereo bx, benign, done at outside facility clip is 8               shape 2016: CARPAL TUNNEL RELEASE; Right 2018: CARPAL TUNNEL RELEASE; Left     Comment:  Ulnar relase 2004: CESAREAN SECTION 2014: CHOLECYSTECTOMY 06/20/2022: COLONOSCOPY 2002: DILATION AND CURETTAGE, DIAGNOSTIC / THERAPEUTIC 2008: HYSTEROSCOPY W/ ENDOMETRIAL ABLATION 2019: OTHER SURGICAL HISTORY; Right     Comment:  R ulnar relocation No date: TRIGGER FINGER RELEASE; Right     Comment:  THUMB AND MIDDLE FINGER 2004: TUBAL LIGATION No date: ULNAR NERVE  DECOMPRESSION; Left   Reproductive/Obstetrics negative OB ROS                             Anesthesia Physical Anesthesia Plan  ASA: 2  Anesthesia Plan: General ETT   Post-op Pain Management: Regional block*   Induction:   PONV Risk Score and Plan: 4 or greater  Airway Management Planned:   Additional Equipment:   Intra-op Plan:   Post-operative Plan:   Informed Consent: I have reviewed the patients History and Physical, chart, labs and discussed the procedure including the risks, benefits and alternatives for the proposed anesthesia with the patient or authorized representative who has indicated his/her understanding and acceptance.     Dental Advisory Given  Plan Discussed with: CRNA  Anesthesia Plan Comments:        Anesthesia Quick Evaluation

## 2024-02-06 NOTE — Discharge Instructions (Signed)
 Orthopedic discharge instructions: Keep dressing dry and intact.  May shower after dressing changed on post-op day #4 (Saturday).  Cover staples/sutures with Band-Aids after drying off. Apply ice frequently to shoulder. Take Aleve 2 tabs BID OR Meloxicam daily with meals for 3-5 days, then as necessary. Take oxycodone as prescribed when needed.  May supplement with ES Tylenol if necessary. Keep shoulder immobilizer on at all times except may remove for bathing purposes. Follow-up in 10-14 days or as scheduled.

## 2024-02-06 NOTE — Anesthesia Procedure Notes (Signed)
 Procedure Name: Intubation Date/Time: 02/06/2024 10:45 AM  Performed by: Morene Crocker, CRNAPre-anesthesia Checklist: Patient identified, Patient being monitored, Timeout performed, Emergency Drugs available and Suction available Patient Re-evaluated:Patient Re-evaluated prior to induction Oxygen Delivery Method: Circle system utilized Preoxygenation: Pre-oxygenation with 100% oxygen Induction Type: IV induction Ventilation: Mask ventilation without difficulty Laryngoscope Size: 3 and McGrath Grade View: Grade I Tube type: Oral Tube size: 6.5 mm Number of attempts: 1 Airway Equipment and Method: Stylet Placement Confirmation: ETT inserted through vocal cords under direct vision, positive ETCO2 and breath sounds checked- equal and bilateral Secured at: 21 cm Tube secured with: Tape Dental Injury: Teeth and Oropharynx as per pre-operative assessment  Comments: Smooth atraumatic intubation, no complications noted.

## 2024-02-06 NOTE — Op Note (Signed)
 02/06/2024  12:36 PM  Patient:   Ashley Fox  Pre-Op Diagnosis:   Impingement/tendinopathy with partial-thickness rotator cuff tear and biceps tendinopathy, left shoulder.  Post-Op Diagnosis:   Impingement/tendinopathy with partial-thickness rotator cuff tear, type I SLAP tear, and biceps tendinopathy, left shoulder.  Procedure:   Limited arthroscopic debridement, arthroscopic subacromial decompression, mini-open rotator cuff repair using a Smith & Nephew Regeneten patch, and mini-open biceps tenodesis, left shoulder.  Anesthesia:   General endotracheal with interscalene block placed preoperatively by the anesthesiologist.  Surgeon:   Maryagnes Amos, MD  Assistant:   Josefa Half, ST  Findings:   As above. There was an articular sided partial-thickness tear involving the anterior insertional fibers of the supraspinatus tendon. The remaining portions of the rotator cuff all were in satisfactory condition. There was a type I SLAP tear extending from the 10:30 to the 1 o'clock position without frank detachment from the glenoid rim. The biceps tendon demonstrated moderate tendinopathic changes with evidence of scuffing. There was extensive synovitis throughout the anterior and superior portions of the joint. The articular surfaces of the glenoid and humerus both were in satisfactory condition.  Complications:   None  Estimated blood loss:   15 cc  Fluids:   600 cc  Tourniquet time:   None  Drains:   None  Closure:   Staples      Brief clinical note:   The patient is a 48 year old female with a history of progressively worsening left shoulder pain. Her symptoms have progressed despite medications, activity modification, etc. The patient's history and examination are consistent with impingement/tendinopathy with a rotator cuff tear. These findings were confirmed by MRI scan. The patient presents at this time for definitive management of these shoulder symptoms.  Procedure:   The  patient underwent placement of an interscalene block by the anesthesiologist in the preoperative holding area before being brought into the operating room and lain in the supine position. The patient then underwent general endotracheal intubation and anesthesia before being repositioned in the beach chair position using the beach chair positioner. The left shoulder and upper extremity were prepped with ChloraPrep solution before being draped sterilely. Preoperative antibiotics were administered. A timeout was performed to confirm the proper surgical site before the expected portal sites and incision site were injected with 0.5% Sensorcaine with epinephrine.   A posterior portal was created and the glenohumeral joint thoroughly inspected with the findings as described above. An anterior portal was created using an outside-in technique. The labrum and rotator cuff were further probed, again confirming the above-noted findings. The areas of labral fraying were debrided back to stable margins using the full-radius resector. Areas of extensive synovitis also debrided back to stable margins using the full-radius resector. Finally, the area of partial-thickness tearing involving the anterior insertional fibers of the supraspinatus tendon were debrided back to stable margins using the full-radius resector. This appeared to incorporate approximately 40% of the footprint. The ArthroCare wand was inserted and used to release the biceps tendon from his labral anchor.  It also was used to obtain hemostasis as well as to "anneal" the labrum superiorly and anteriorly. The instruments were removed from the joint after suctioning the excess fluid.  The camera was repositioned through the posterior portal into the subacromial space. A separate lateral portal was created using an outside-in technique. The 3.5 mm full-radius resector was introduced and used to perform a subtotal bursectomy. The ArthroCare wand was then inserted and  used to remove the periosteal  tissue off the undersurface of the anterior third of the acromion as well as to recess the coracoacromial ligament from its attachment along the anterior and lateral margins of the acromion. The 4.0 mm acromionizing bur was introduced and used to complete the decompression by removing the undersurface of the anterior third of the acromion. The full radius resector was reintroduced to remove any residual bony debris before the ArthroCare wand was reintroduced to obtain hemostasis. The instruments were then removed from the subacromial space after suctioning the excess fluid.  An approximately 4-5 cm incision was made over the anterolateral aspect of the shoulder beginning at the anterolateral corner of the acromion and extending distally in line with the bicipital groove. This incision was carried down through the subcutaneous tissues to expose the deltoid fascia. The raphae between the anterior and middle thirds was identified and this plane developed to provide access into the subacromial space. Additional bursal tissues were debrided sharply using Metzenbaum scissors. The area of partial-thickness rotator cuff tearing was readily identified by palpation as an area of "softening".   The bicipital groove was identified by palpation and opened for 1-1.5 cm. The biceps tendon stump was retrieved through this defect. The floor of the bicipital groove was roughened with a curet before a Biomet 2.9 mm JuggerKnot anchor was inserted. Both sets of sutures were passed through the biceps tendon and tied securely to effect the tenodesis. The bicipital sheath was reapproximated using two #0 Ethibond interrupted sutures, incorporating the biceps tendon to further reinforce the tenodesis.  Given that less than 50% of the tendon footprint was involved, it was elected to leave the remaining portion intact and to apply a Smith & Nephew Regeneten patch over the defect site. Therefore, a medium  patch was selected and secured using the appropriate bone and soft tissue staples.  The wound was copiously irrigated with sterile saline solution before the deltoid raphae was reapproximated using 2-0 Vicryl interrupted sutures. The subcutaneous tissues were closed in two layers using 2-0 Vicryl interrupted sutures before the skin was closed using staples. The portal sites also were closed using staples. A sterile bulky dressing was applied to the shoulder before the arm was placed into a shoulder immobilizer. The patient was then awakened, extubated, and returned to the recovery room in satisfactory condition after tolerating the procedure well.

## 2024-02-06 NOTE — H&P (Signed)
 History of Present Illness: Ashley Fox is a 48 y.o. female who presents today history and physical for a left shoulder arthroscopy with Dr. Joice Lofts on 02/06/2024. Patient has been having 8 months of left shoulder pain. She had an MRI showing tearing of the supraspinatus and infraspinatus tendons with moderate to severe tendinitis. She has undergone injections, medications, physical therapy with little relief. She has severe debilitating pain and function of her left shoulder that is interfering with her quality of life and activities daily living. Patient's pain is worse at night. She is unable to get comfortable. She has tried tramadol with no relief. She has a hard time brushing and drying her hair. Her pain is 7 out of 10 today. No history of DVT. She is diabetic. Last A1c 6.9  Past Medical History: Anxiety  Diabetes mellitus type I (CMS/HHS-HCC)  Gallbladder disease  Hemorrhoids  Hyperlipidemia  Hypothyroidism  Migraines  Sleep apnea   Past Surgical History: DILATION AND CURETTAGE, DIAGNOSTIC / THERAPEUTIC 2002  CESAREAN SECTION 2004  CHOLECYSTECTOMY 2013  ENDOSCOPIC CARPAL TUNNEL RELEASE Right 2016  ENDOSCOPIC CARPAL TUNNEL RELEASE Left 2018  COLONOSCOPY 06/20/2022  Diverticulosis/otherwise normal/repeat 10 yrs/TKT  HYSTEROSCOPY W/ ENDOMETRIAL ABLATION   Past Family History: Allergies Mother  Asthma Mother  High blood pressure (Hypertension) Mother  Diabetes type II Father  Depression Father  High blood pressure (Hypertension) Father  Crohn's disease Father  Sick sinus syndrome Father  x 4 ablations  Pacemaker Father  Stroke Brother  Other Brother (car accident)  Dementia Maternal Grandmother  High blood pressure (Hypertension) Maternal Grandmother  Stroke Maternal Grandmother  Myocardial Infarction (Heart attack) Maternal Grandfather  High blood pressure (Hypertension) Maternal Grandfather  Alcohol abuse Paternal Grandmother  Cirrhosis Paternal Grandmother  Diabetes  type I Paternal Grandfather  High blood pressure (Hypertension) Paternal Grandfather  Lupus Daughter  Anxiety Daughter  Depression Daughter  Obesity Daughter  Allergic rhinitis Daughter  Anxiety Daughter  ADD / ADHD Daughter  Asperger's syndrome Son  Allergic rhinitis Son  Depression Son   Medications: ergocalciferol, vitamin D2, 1,250 mcg (50,000 unit) capsule TAKE 1 CAPSULE (50,000 UNITS TOTAL) BY MOUTH EVERY 7 (SEVEN) DAYS 12 capsule 2  escitalopram oxalate (LEXAPRO) 20 MG tablet Take 1 tablet (20 mg total) by mouth once daily 90 tablet 3  fenofibrate nanocrystallized (TRICOR) 145 MG tablet take 1 tablet by mouth once daily. 90 tablet 3  fluticasone propionate (FLONASE) 50 mcg/actuation nasal spray Place 2 sprays into both nostrils once daily  HYDROcodone-acetaminophen (NORCO) 5-325 mg tablet Take 1 tablet by mouth every 12 (twelve) hours as needed 14 tablet 0  insulin ASPART (NOVOLOG FLEXPEN U-100 INSULIN) pen injector (concentration 100 units/mL) Inject up to 50 units daily in divided doses as prescribed 45 mL 1  insulin DEGLUDEC (TRESIBA FLEXTOUCH U-200) pen injector (concentration 200 units/mL) Inject 50 Units subcutaneously at bedtime 45 mL 1  JARDIANCE 25 mg tablet Take 1 tablet (25 mg total) by mouth once daily 90 tablet 3  levocetirizine (XYZAL) 5 MG tablet Take 5 mg by mouth every evening  levothyroxine (SYNTHROID) 88 MCG tablet take 1 tablet by mouth every day 90 tablet 3  meloxicam (MOBIC) 15 MG tablet TAKE 1 TABLET BY MOUTH EVERY DAY 30 tablet 2  omeprazole (PRILOSEC) 40 MG DR capsule take 1 capsule by mouth every day 90 capsule 1  pen needle, diabetic 32 gauge x 5/16" needle Use as directed four times daily as needed 400 each 3  rosuvastatin (CRESTOR) 10 MG tablet Take 1  tablet (10 mg total) by mouth at bedtime 90 tablet 3  tirzepatide (MOUNJARO) 10 mg/0.5 mL pen injector INJECT 10 MG SUBCUTANEOUSLY EVERY 7 (SEVEN) DAYS 6 mL 3  zolpidem (AMBIEN) 10 mg tablet TAKE 1/2  TABLET (5 MG TOTAL) BY MOUTH EVERY DAY AT BEDTIME AS NEEDED FOR SLEEP 30 tablet 0   Allergies: Avocado (Laurus [place]) Other (See Comments)  Erythromycin Hives and Nausea And Vomiting  Imitrex [Sumatriptan] Headache  Sulfa (Sulfonamide Antibiotics) Hives   Review of Systems:  A comprehensive 14 point ROS was performed, reviewed by me today, and the pertinent orthopaedic findings are documented in the HPI.  Physical Exam: BP 96/62  Ht 167.6 cm (5\' 6" )  Wt 62.5 kg (137 lb 12.8 oz)  BMI 22.24 kg/m  Well developed, well nourished, no apparent distress, normal affect, normal gait with no antalgic component.   HEENT: Head normocephalic, atraumatic, PERRL.   Abdomen: Soft, non tender, non distended, Bowel sounds present.  Heart: Examination of the heart reveals regular, rate, and rhythm. There is no murmur noted on ascultation. There is a normal apical pulse.  Lungs: Lungs are clear to auscultation. There is no wheeze, rhonchi, or crackles. There is normal expansion of bilateral chest walls.   Left shoulder: Examination of the left shoulder shows limited active abduction to 90 degrees. She has positive Hawkins and impingement test. Minimally tender to palpation over the [facility] joint. She can actively flex to 130 degrees. 60 degrees of external rotation. Internal rotation 60 degrees. Neurovascular intact in left upper extremity. Normal radial and ulnar pulses  Imaging: MRI OF THE LEFT SHOULDER WITHOUT CONTRAST:  1. Mild tendinosis of the anterior infraspinatus through the  anterior supraspinatus tendon footprint. Superimposed mild to  moderate interstitial tears within portions of the midsubstance and  the bursal surface of the anterior supraspinatus tendon footprint.  Punctate partial-thickness linear bursal sided tear of the anterior  infraspinatus tendon footprint seen on a single sagittal image only.  No tendon retraction.  2. Mild tendinosis of the deep aspect of the subscapularis  tendon  insertion.  3. Mild degenerative changes of the acromioclavicular joint.  4. Mild subacromial/subdeltoid bursitis.   Impression: 1. Biceps tendinitis of left upper extremity. 2. Tendinopathy of left rotator cuff. 3. Nontraumatic incomplete tear of left rotator cuff.  Plan:  48 year old female with an 8 month history of progressing left shoulder pain with MRI showing tear of the supraspinatus and infraspinatus tendon. No relief with physical therapy, injections, medications, activity modification. Pain is severe and interfering with her quality of life and activities daily living. Risks, benefits, complications of a left shoulder arthroscopy with debridement, decompression, biceps tenodesis and possible rotator cuff repair have been discussed with the patient. Patient has agreed and consented the procedure with Dr. Joice Lofts on 02/06/2024  The procedure was discussed with the patient, as were the potential risks (including bleeding, infection, nerve and/or blood vessel injury, persistent or recurrent pain, failure of the repair, progression of arthritis, need for further surgery, blood clots, strokes, heart attacks and/or arhythmias, pneumonia, etc.) and benefits. The patient states her understanding and wishes to proceed.    H&P reviewed and patient re-examined. No changes.

## 2024-02-06 NOTE — Anesthesia Procedure Notes (Signed)
 Anesthesia Regional Block: Interscalene brachial plexus block   Pre-Anesthetic Checklist: , timeout performed,  Correct Patient, Correct Site, Correct Laterality,  Correct Procedure, Correct Position, site marked,  Risks and benefits discussed,  Surgical consent,  Pre-op evaluation,  At surgeon's request and post-op pain management  Laterality: Left  Prep: chloraprep       Needles:  Injection technique: Single-shot  Needle Type: Echogenic Stimulator Needle     Needle Length: 10cm  Needle Gauge: 20     Additional Needles:   Procedures:, nerve stimulator,,, ultrasound used (permanent image in chart),,     Nerve Stimulator or Paresthesia:  Response: biceps flexion  Additional Responses:   Narrative:  End time: 02/06/2024 9:32 AM Injection made incrementally with aspirations every 5 mL.  Performed by: Personally   Additional Notes: Functioning IV was confirmed and monitors were applied.  . Sterile prep and drape,hand hygiene and sterile gloves were used.  Negative aspiration and negative test dose prior to incremental administration of local anesthetic. The patient tolerated the procedure well.

## 2024-02-06 NOTE — Transfer of Care (Signed)
 Immediate Anesthesia Transfer of Care Note  Patient: Ashley Fox  Procedure(s) Performed: ARTHROSCOPY, SHOULDER WITH DEBRIDEMENT (Left: Shoulder)  Patient Location: PACU  Anesthesia Type:General  Level of Consciousness: awake, alert , and oriented  Airway & Oxygen Therapy: Patient Spontanous Breathing  Post-op Assessment: Report given to RN and Post -op Vital signs reviewed and stable  Post vital signs: Reviewed and stable  Last Vitals:  Vitals Value Taken Time  BP 113/74 02/06/24 1234  Temp 36.4 C 02/06/24 1234  Pulse 105 02/06/24 1239  Resp 16 02/06/24 1239  SpO2 95 % 02/06/24 1239  Vitals shown include unfiled device data.  Last Pain:  Vitals:   02/06/24 1234  TempSrc:   PainSc: Asleep         Complications: No notable events documented.

## 2024-02-07 ENCOUNTER — Encounter: Payer: Self-pay | Admitting: Surgery

## 2024-02-07 NOTE — Anesthesia Postprocedure Evaluation (Signed)
 Anesthesia Post Note  Patient: Ashley Fox  Procedure(s) Performed: ARTHROSCOPY, SHOULDER WITH DEBRIDEMENT (Left: Shoulder)  Patient location during evaluation: PACU Anesthesia Type: General and Regional Level of consciousness: awake and alert Pain management: pain level controlled Vital Signs Assessment: post-procedure vital signs reviewed and stable Respiratory status: spontaneous breathing, nonlabored ventilation, respiratory function stable and patient connected to nasal cannula oxygen Cardiovascular status: blood pressure returned to baseline and stable Postop Assessment: no apparent nausea or vomiting Anesthetic complications: no   No notable events documented.   Last Vitals:  Vitals:   02/06/24 1345 02/06/24 1434  BP: 100/69 94/67  Pulse:  98  Resp:    Temp:    SpO2: 100% 100%    Last Pain:  Vitals:   02/06/24 1319  TempSrc:   PainSc: 0-No pain                 Louie Boston

## 2024-02-20 DIAGNOSIS — M6281 Muscle weakness (generalized): Secondary | ICD-10-CM | POA: Diagnosis not present

## 2024-02-26 DIAGNOSIS — M6281 Muscle weakness (generalized): Secondary | ICD-10-CM | POA: Diagnosis not present

## 2024-03-11 DIAGNOSIS — M6281 Muscle weakness (generalized): Secondary | ICD-10-CM | POA: Diagnosis not present

## 2024-03-18 DIAGNOSIS — M6281 Muscle weakness (generalized): Secondary | ICD-10-CM | POA: Diagnosis not present

## 2024-03-28 DIAGNOSIS — M6281 Muscle weakness (generalized): Secondary | ICD-10-CM | POA: Diagnosis not present

## 2024-05-08 ENCOUNTER — Other Ambulatory Visit: Payer: Self-pay | Admitting: Obstetrics and Gynecology

## 2024-05-08 DIAGNOSIS — Z1231 Encounter for screening mammogram for malignant neoplasm of breast: Secondary | ICD-10-CM

## 2024-05-15 ENCOUNTER — Ambulatory Visit
Admission: RE | Admit: 2024-05-15 | Discharge: 2024-05-15 | Disposition: A | Discharge status: Home / Self Care | Source: Ambulatory Visit | Attending: Obstetrics and Gynecology | Admitting: Obstetrics and Gynecology

## 2024-05-15 DIAGNOSIS — Z1231 Encounter for screening mammogram for malignant neoplasm of breast: Secondary | ICD-10-CM | POA: Diagnosis present

## 2024-05-23 ENCOUNTER — Other Ambulatory Visit: Payer: Self-pay | Admitting: Obstetrics and Gynecology

## 2024-05-23 DIAGNOSIS — R928 Other abnormal and inconclusive findings on diagnostic imaging of breast: Secondary | ICD-10-CM

## 2024-05-27 ENCOUNTER — Ambulatory Visit: Payer: Self-pay | Admitting: Obstetrics and Gynecology

## 2024-05-28 ENCOUNTER — Ambulatory Visit
Admission: RE | Admit: 2024-05-28 | Discharge: 2024-05-28 | Disposition: A | Discharge status: Home / Self Care | Source: Ambulatory Visit | Attending: Obstetrics and Gynecology | Admitting: Obstetrics and Gynecology

## 2024-05-28 ENCOUNTER — Ambulatory Visit: Payer: Self-pay | Admitting: Obstetrics and Gynecology

## 2024-05-28 DIAGNOSIS — R928 Other abnormal and inconclusive findings on diagnostic imaging of breast: Secondary | ICD-10-CM | POA: Diagnosis present

## 2024-06-11 ENCOUNTER — Other Ambulatory Visit: Payer: Self-pay | Admitting: Surgery

## 2024-06-12 ENCOUNTER — Other Ambulatory Visit: Payer: Self-pay

## 2024-06-12 ENCOUNTER — Encounter
Admission: RE | Admit: 2024-06-12 | Discharge: 2024-06-12 | Disposition: A | Discharge status: Home / Self Care | Source: Ambulatory Visit | Attending: Surgery | Admitting: Surgery

## 2024-06-12 DIAGNOSIS — Z01812 Encounter for preprocedural laboratory examination: Secondary | ICD-10-CM

## 2024-06-12 DIAGNOSIS — E119 Type 2 diabetes mellitus without complications: Secondary | ICD-10-CM

## 2024-06-12 HISTORY — DX: Adhesive capsulitis of left shoulder: M75.02

## 2024-06-12 HISTORY — DX: Long term (current) use of insulin: Z79.4

## 2024-06-12 NOTE — Patient Instructions (Addendum)
 Your procedure is scheduled on: 06/19/24 - Wednesday Report to the Registration Desk on the 1st floor of the [facility]. To find out your arrival time, please call [phone] between 1PM - 3PM on: 06/18/24 - Tuesday If your arrival time is 6:00 am, do not arrive before that time as the [facility] entrance doors do not open until 6:00 am.  REMEMBER: Instructions that are not followed completely may result in serious medical risk, up to and including death; or upon the discretion of your surgeon and anesthesiologist your surgery may need to be rescheduled.  Do not eat food after midnight the night before surgery.  No gum chewing or hard candies.  You may however, drink CLEAR liquids up to 2 hours before you are scheduled to arrive for your surgery. Do not drink anything within 2 hours of your scheduled arrival time.  Clear liquids include: - water   **Type 1 and Type 2 diabetics should only drink water.**  In addition, your doctor has ordered for you to drink the provided:  Gatorade G2 Drinking this carbohydrate drink up to two hours before surgery helps to reduce insulin resistance and improve patient outcomes. Please complete drinking 2 hours before scheduled arrival time.  One week prior to surgery: Stop Anti-inflammatories (NSAIDS) such as meloxicam (MOBIC) , Advil , Aleve, Ibuprofen , Motrin , Naproxen, Naprosyn and Aspirin based products such as Excedrin, Goody's Powder, BC Powder. You may continue to take Tylenol  if needed for pain up until the day of surgery.  Stop ANY OVER THE COUNTER supplements until after surgery.  Jardiance - hold for 3 days before surgery. Last day to take is  07/26, Resume AFTER surgery.   tirzepatide (MOUNJARO) - hold for 7 days before surgery.  Resume AFTER surgery on your regular weekly day,    Continue taking all of your other prescription medications up until the day before surgery.   [place] - only take half of your regular dose the night  before surgery. Only take 25 units on Tuesday night.    Do NOT take ANY insulin on the morning of surgery.   ON THE DAY OF SURGERY ONLY TAKE THESE MEDICATIONS WITH SIPS OF WATER:   escitalopram (LEXAPRO) levothyroxine (SYNTHROID)  omeprazole (PRILOSEC)    No Alcohol for 24 hours before or after surgery.  No Smoking including e-cigarettes for 24 hours before surgery.  No chewable tobacco products for at least 6 hours before surgery.  No nicotine patches on the day of surgery.  Do not use any recreational drugs for at least a week (preferably 2 weeks) before your surgery.  Please be advised that the combination of cocaine and anesthesia may have negative outcomes, up to and including death. If you test positive for cocaine, your surgery will be cancelled.  On the morning of surgery brush your teeth with toothpaste and water, you may rinse your mouth with mouthwash if you wish. Do not swallow any toothpaste or mouthwash.  Use CHG Soap or wipes as directed on instruction sheet.  Do not wear jewelry, make-up, hairpins, clips or nail polish.  For welded (permanent) jewelry: bracelets, anklets, waist bands, etc.  Please have this removed prior to surgery.  If it is not removed, there is a chance that hospital personnel will need to cut it off on the day of surgery.  Do not wear lotions, powders, or perfumes.   Do not shave body hair from the neck down 48 hours before surgery.  Contact lenses, hearing aids and dentures  may not be worn into surgery.  Do not bring valuables to the hospital. [facility] is not responsible for any missing/lost belongings or valuables.   Bring your C-PAP to the hospital in case you may have to spend the night.   Notify your doctor if there is any change in your medical condition (cold, fever, infection).  Wear comfortable clothing (specific to your surgery type) to the hospital.  After surgery, you can help prevent lung complications by doing  breathing exercises.  Take deep breaths and cough every 1-2 hours. Your doctor may order a device called an Incentive Spirometer to help you take deep breaths.  When coughing or sneezing, hold a pillow firmly against your incision with both hands. This is called "splinting." Doing this helps protect your incision. It also decreases belly discomfort.  If you are being admitted to the hospital overnight, leave your suitcase in the car. After surgery it may be brought to your room.  In case of increased patient census, it may be necessary for you, the patient, to continue your postoperative care in the Same Day Surgery department.  If you are being discharged the day of surgery, you will not be allowed to drive home. You will need a responsible individual to drive you home and stay with you for 24 hours after surgery.   If you are taking public transportation, you will need to have a responsible individual with you.  Please call the Pre-admissions Testing Dept. at [phone] if you have any questions about these instructions.  Surgery Visitation Policy:  Patients having surgery or a procedure may have two visitors.  Children under the age of 26 must have an adult with them who is not the patient.  Inpatient Visitation:    Visiting hours are 7 a.m. to 8 p.m. Up to four visitors are allowed at one time in a patient room. The visitors may rotate out with other people during the day.  One visitor age 20 or older may stay with the patient overnight and must be in the room by 8 p.m.   Merchandiser, retail to address health-related social needs:  [email or web address]     Preparing for Surgery with CHLORHEXIDINE  GLUCONATE (CHG) Soap  Chlorhexidine  Gluconate (CHG) Soap  o An antiseptic cleaner that kills germs and bonds with the skin to continue killing germs even after washing  o Used for showering the night before surgery and morning of surgery  Before surgery,  you can play an important role by reducing the number of germs on your skin.  CHG (Chlorhexidine  gluconate) soap is an antiseptic cleanser which kills germs and bonds with the skin to continue killing germs even after washing.  Please do not use if you have an allergy to CHG or antibacterial soaps. If your skin becomes reddened/irritated stop using the CHG.  1. Shower the NIGHT BEFORE SURGERY and the MORNING OF SURGERY with CHG soap.  2. If you choose to wash your hair, wash your hair first as usual with your normal shampoo.  3. After shampooing, rinse your hair and body thoroughly to remove the shampoo.  4. Use CHG as you would any other liquid soap. You can apply CHG directly to the skin and wash gently with a scrungie or a clean washcloth.  5. Apply the CHG soap to your body only from the neck down. Do not use on open wounds or open sores. Avoid contact with your eyes, ears, mouth, and genitals (private parts). Wash face and  genitals (private parts) with your normal soap.  6. Wash thoroughly, paying special attention to the area where your surgery will be performed.  7. Thoroughly rinse your body with warm water.  8. Do not shower/wash with your normal soap after using and rinsing off the CHG soap.  9. Pat yourself dry with a clean towel.  10. Wear clean pajamas to bed the night before surgery.  12. Place clean sheets on your bed the night of your first shower and do not sleep with pets.  13. Shower again with the CHG soap on the day of surgery prior to arriving at the hospital.  14. Do not apply any deodorants/lotions/powders.  15. Please wear clean clothes to the hospital.

## 2024-06-13 ENCOUNTER — Encounter
Admission: RE | Admit: 2024-06-13 | Discharge: 2024-06-13 | Disposition: A | Discharge status: Home / Self Care | Source: Ambulatory Visit | Attending: Surgery | Admitting: Surgery

## 2024-06-13 DIAGNOSIS — Z794 Long term (current) use of insulin: Secondary | ICD-10-CM | POA: Diagnosis not present

## 2024-06-13 DIAGNOSIS — E119 Type 2 diabetes mellitus without complications: Secondary | ICD-10-CM | POA: Insufficient documentation

## 2024-06-13 DIAGNOSIS — Z01812 Encounter for preprocedural laboratory examination: Secondary | ICD-10-CM | POA: Diagnosis present

## 2024-06-13 LAB — CBC
HCT: 39.9 % (ref 36.0–46.0)
Hemoglobin: 13.7 g/dL (ref 12.0–15.0)
MCH: 29.8 pg (ref 26.0–34.0)
MCHC: 34.3 g/dL (ref 30.0–36.0)
MCV: 86.9 fL (ref 80.0–100.0)
Platelets: 268 K/uL (ref 150–400)
RBC: 4.59 MIL/uL (ref 3.87–5.11)
RDW: 12.2 % (ref 11.5–15.5)
WBC: 8.1 K/uL (ref 4.0–10.5)
nRBC: 0 % (ref 0.0–0.2)

## 2024-06-18 MED ORDER — CEFAZOLIN SODIUM-DEXTROSE 2-4 GM/100ML-% IV SOLN
2.0000 g | INTRAVENOUS | Status: AC
  Administered 2024-06-19: 2 g via INTRAVENOUS

## 2024-06-18 MED ORDER — SODIUM CHLORIDE 0.9 % IV SOLN: INTRAVENOUS | Status: DC

## 2024-06-18 MED ORDER — CHLORHEXIDINE GLUCONATE 0.12 % MT SOLN
15.0000 mL | Freq: Once | OROMUCOSAL | Status: AC
  Administered 2024-06-19: 15 mL via OROMUCOSAL

## 2024-06-18 MED ORDER — ORAL CARE MOUTH RINSE: 15.0000 mL | Freq: Once | OROMUCOSAL | Status: AC

## 2024-06-18 NOTE — H&P (Signed)
 History of Present Illness:  Ashley Fox is a 48 y.o. female who presents for follow-up now 4+ months status post a limited arthroscopic debridement, arthroscopic subacromial decompression, mini-open rotator cuff repair using a [employer] Regeneten patch, and a mini-open biceps tenodesis of her left shoulder. The patient continues to experience mild-moderate pain in her shoulder which she rates at 3/10 on today's visit, and for which she has been taking meloxicam on a daily basis as well as an occasional Tylenol  with limited benefit. Her primary concern is continued limited range of motion of her shoulder. She has difficulty reaching behind her back or above shoulder Fox. She also has pain at night if she sleeps on her left side. These symptoms have persisted despite extensive physical therapy. She saw Ashley Level, PA-C, 1 month ago, who diagnosed her with adhesive capsulitis. He had Dr. Sharrie evaluate her shoulder under ultrasound guidance. According to his report, the rotator cuff repair appeared to be intact, but there did appear to be some increased bursitis in the subacromial space. The patient has been referred back to me for further evaluation and treatment. The patient denies any injury to the shoulder since her surgical procedure, and denies any fevers or chills. She also denies any numbness or paresthesias down her arm to her hand.  Current Outpatient Medications:  BIJUVA 1-100 mg Cap Take 1 capsule by mouth once daily  levocetirizine (XYZAL) 5 MG tablet Take 5 mg by mouth every evening  acetaminophen  (TYLENOL ) 500 MG tablet Take 1,000 mg by mouth  blood-glucose sensor (DEXCOM G7 SENSOR) Devi Use 1 each every 10 (ten) days 9 each 1  EPINEPHrine  (EPIPEN ) 0.3 mg/0.3 mL auto-injector INJECT 0.3 ML (0.3 MG TOTAL) INTO THE MUSCLE ONCE AS NEEDED FOR ANAPHYLAXIS FOR UP TO 1 DOSE 1 Pen 1  ergocalciferol, vitamin D2, 1,250 mcg (50,000 unit) capsule TAKE 1 CAPSULE (50,000 UNITS TOTAL) BY MOUTH  EVERY 7 (SEVEN) DAYS 12 capsule 2  escitalopram oxalate (LEXAPRO) 20 MG tablet TAKE 1 TABLET BY MOUTH EVERY DAY 90 tablet 1  fenofibrate nanocrystallized (TRICOR) 145 MG tablet take 1 tablet by mouth once daily. 90 tablet 3  insulin  ASPART (NOVOLOG  FLEXPEN U-100 INSULIN ) pen injector (concentration 100 units/mL) Inject up to 50 units daily in divided doses as prescribed 45 mL 1  insulin  DEGLUDEC (TRESIBA FLEXTOUCH U-200) pen injector (concentration 200 units/mL) Inject 50 Units subcutaneously at bedtime 45 mL 1  JARDIANCE 25 mg tablet TAKE 1 TABLET BY MOUTH EVERY DAY 90 tablet 3  levothyroxine (SYNTHROID) 88 MCG tablet take 1 tablet by mouth every day 90 tablet 3  meloxicam (MOBIC) 15 MG tablet TAKE 1 TABLET BY MOUTH EVERY DAY 30 tablet 2  omeprazole (PRILOSEC) 40 MG DR capsule TAKE 1 CAPSULE BY MOUTH EVERY DAY 90 capsule 1  oxyCODONE  (ROXICODONE ) 5 MG immediate release tablet Take 1 tablet (5 mg total) by mouth every 6 (six) hours as needed for Pain 30 tablet 0  pen needle, diabetic 32 gauge x 5/16 needle Use as directed four times daily as needed 400 each 3  rosuvastatin (CRESTOR) 10 MG tablet TAKE 1 TABLET BY MOUTH EVERYDAY AT BEDTIME 90 tablet 1  tirzepatide (MOUNJARO) 12.5 mg/0.5 mL pen injector Inject 0.5 mLs (12.5 mg total) subcutaneously once a week 6 mL 1  zolpidem (AMBIEN) 10 mg tablet TAKE 1/2 TABLET (5 MG TOTAL) BY MOUTH EVERY DAY AT BEDTIME AS NEEDED FOR SLEEP 30 tablet 0   Allergies:  Adhesive Rash  Surgical tape caused  contact dermatitis.  Adhesive Tape-Silicones Dermatitis  Surgical tape caused chemical dermatitis.  Avocado (Laurus Persea) Other (See Comments)  Erythromycin Hives and Nausea And Vomiting  Imitrex [Sumatriptan] Headache  Sulfa (Sulfonamide Antibiotics) Hives   Past Medical History:  Anxiety  Diabetes mellitus type I (CMS/HHS-HCC)  Gallbladder disease  Hemorrhoids  Hyperlipidemia  Hypothyroidism  Infertility management 1998  PCOS; 2 pregnancies with  clomid  Migraines  Sleep apnea   Past Surgical History:  DILATION AND CURETTAGE, DIAGNOSTIC / THERAPEUTIC 2002  CESAREAN SECTION 2004  CHOLECYSTECTOMY 2013  ENDOSCOPIC CARPAL TUNNEL RELEASE Right 2016  ENDOSCOPIC CARPAL TUNNEL RELEASE Left 2018  COLONOSCOPY 06/20/2022  Diverticulosis/otherwise normal/repeat 10 yrs/TKT  ARTHROSCOPY SHOULDER Left 02/06/2024 (Dr.Ciria Bernardini)  CHOLECYSTECTOMY 2013  DILATION AND CURETTAGE OF UTERUS 2002 (Blighted ovum)  HYSTEROSCOPY W/ ENDOMETRIAL ABLATION  TUBAL LIGATION 2004   Family History:  Allergies Mother Ashley Fox  Asthma Mother Ashley Fox  High blood pressure (Hypertension) Mother Ashley Fox  Diabetes type II Father Ashley Fox  Depression Father Ashley Fox  High blood pressure (Hypertension) Father Ashley Fox  Crohn's disease Father Ashley Fox  Sick sinus syndrome Father Ashley Fox  x 4 ablations  Pacemaker Father Ashley Fox  Diabetes Father Ashley Fox  Initially T2DM but now considered T1DM  Skin cancer Father Ashley Fox  Head lesions  Stroke Brother  Other Brother  car accident  Dementia Maternal Grandmother Ashley Fox  High blood pressure (Hypertension) Maternal Grandmother Ashley Fox  Stroke Maternal Grandmother Ashley Fox  Died after series of CVAs  Myocardial Infarction (Heart attack) Maternal Grandfather Ashley Fox  High blood pressure (Hypertension) Maternal Grandfather Ashley Fox  Heart disease Maternal Grandfather Ashley Fox  Died from massive MI  Skin cancer Maternal Grandfather Ashley Fox  Head lesions  Alcohol abuse Paternal Grandmother  Cirrhosis Paternal Grandmother  Diabetes type I Paternal Ashley Fox  High blood pressure (Hypertension) Paternal Grandfather Ashley Fox  Diabetes Paternal Grandfather Ashley Fox  T1DM- diagnosed as an adult  Lupus Daughter Ashley Fox  Anxiety Daughter Ashley Fox  Depression Daughter Ashley Fox  Obesity Daughter Ashley Fox  Allergic rhinitis Daughter Ashley Fox  Anxiety Daughter Ashley Fox  ADD / ADHD Daughter Ashley Fox  Asperger's syndrome Son Ashley Fox  Allergic rhinitis Son Ashley Fox  Depression Son Ashley Fox  Heart  disease Maternal Uncle Ashley Fox  2 MIs  Heart disease Maternal Uncle Ashley Fox  CABG; died from MI   Social History:   Socioeconomic History:  Marital status: Married  Tobacco Use  Smoking status: Never  Smokeless tobacco: Never  Vaping Use  Vaping status: Never Used  Substance and Sexual Activity  Alcohol use: Yes  Comment: Social drinker maybe 1 drink per week  Drug use: Never  Sexual activity: Yes  Partners: Male  Birth control/protection: Surgical  Comment: Zero sexual drive  Other Topics Concern  Would you please tell us  about the people who live in your home, your pets, or anything else important to your social life? Yes  Comment: My husband Oneil and adult daughter   Social Drivers of Health:   Physicist, medical Strain: Low Risk (05/13/2024)  Overall Financial Resource Strain (CARDIA)  Difficulty of Paying Living Expenses: Not hard at all  Food Insecurity: No Food Insecurity (05/13/2024)  Hunger Vital Sign  Worried About Running Out of Food in the Last Year: Never true  Ran Out of Food in the Last Year: Never true  Recent Concern: Food Insecurity - Food Insecurity Present (04/03/2024)  Hunger Vital Sign  Worried About Running Out of Food in the Last Year: Sometimes true  Ran Out of Food in the Last Year: Never true  Transportation Needs: No Transportation  Needs (05/13/2024)  PRAPARE - Risk analyst (Medical): No  Lack of Transportation (Non-Medical): No   Review of Systems:  A comprehensive 14 point ROS was performed, reviewed, and the pertinent orthopaedic findings are documented in the HPI.  Physical Exam: Vitals:  06/10/24 1136  BP: 110/66  Weight: 64.9 kg (143 lb)  Height: 165.1 cm (5' 5)  PainSc: 3  PainLoc: Shoulder   General/Constitutional: The patient appears to be well-nourished, well-developed, and in no acute distress. Neuro/Psych: Normal mood and affect, oriented to person, place and time. Eyes: Non-icteric. Pupils are  equal, round, and reactive to light, and exhibit synchronous movement. ENT: Unremarkable. Lymphatic: No palpable adenopathy. Respiratory: Lungs clear to auscultation, Normal chest excursion, No wheezes, and Non-labored breathing Cardiovascular: Regular rate and rhythm. No murmurs. and No edema, swelling or tenderness, except as noted in detailed exam. Integumentary: No impressive skin lesions present, except as noted in detailed exam. Musculoskeletal: Unremarkable, except as noted in detailed exam.  Left shoulder exam: On inspection, her surgical incision and arthroscopic portal sites are well-healed and without evidence for infection. No swelling, erythema, ecchymosis, abrasions, or other skin abnormalities are identified. She experiences mild-moderate residual tenderness to palpation along the anterior aspect the shoulder. Actively, the patient is able to forward flex to 90 degrees, abduct to 95 degrees, and internally rotate to the left PSIS. Passively, she is able to tolerate forward flexion to 100 degrees and abduction to 100 degrees. At 90 degrees of abduction, she can tolerate external rotation to 60 degrees and internal rotation to 40 degrees. She describes mild-moderate discomfort at the extremes of all motions. She exhibits 4-4+/5 strength with resisted internal and external rotation, as well as with resisted forward flexion and abduction. She remains neurovascularly intact to the left upper extremity and hand.  Assessment: 1. Adhesive capsulitis of left shoulder.  2. Rotator cuff tendinitis, left.  3. Status post repair of partial-thickness rotator cuff tear, left shoulder.  Plan: The treatment options were discussed with the patient. In addition, patient educational materials were provided regarding the diagnosis and treatment options. The patient is quite frustrated by her symptoms and functional limitations, and is ready to consider more aggressive treatment options. Based on her  history and examination, I feel that her primary issue is adhesive capsulitis. Therefore, I have recommended a surgical procedure, specifically a manipulation under anesthesia with steroid injection of the left shoulder. The procedure was discussed with the patient, as were the potential risks (including bleeding, infection, nerve and/or blood vessel injury, persistent or recurrent pain, residual stiffness of the shoulder, residual weakness of the shoulder, need for further surgery, blood clots, strokes, heart attacks and/or arhythmias, pneumonia, etc.) and benefits. The patient states her understanding and wishes to proceed. All of the patient's questions and concerns were answered. She can call any time with further concerns. She will follow up post-surgery, routine.    H&P reviewed and patient re-examined. No changes.

## 2024-06-19 ENCOUNTER — Other Ambulatory Visit: Payer: Self-pay

## 2024-06-19 ENCOUNTER — Encounter
Admission: RE | Disposition: A | Discharge status: Home / Self Care | Payer: Self-pay | Source: Home / Self Care | Attending: Surgery

## 2024-06-19 ENCOUNTER — Ambulatory Visit: Admitting: Anesthesiology

## 2024-06-19 ENCOUNTER — Encounter: Payer: Self-pay | Admitting: Surgery

## 2024-06-19 ENCOUNTER — Ambulatory Visit
Admission: RE | Admit: 2024-06-19 | Discharge: 2024-06-19 | Disposition: A | Discharge status: Home / Self Care | Attending: Surgery | Admitting: Surgery

## 2024-06-19 ENCOUNTER — Ambulatory Visit: Payer: Self-pay | Admitting: Urgent Care

## 2024-06-19 DIAGNOSIS — M7502 Adhesive capsulitis of left shoulder: Secondary | ICD-10-CM | POA: Diagnosis present

## 2024-06-19 DIAGNOSIS — K219 Gastro-esophageal reflux disease without esophagitis: Secondary | ICD-10-CM | POA: Insufficient documentation

## 2024-06-19 DIAGNOSIS — Z7985 Long-term (current) use of injectable non-insulin antidiabetic drugs: Secondary | ICD-10-CM | POA: Diagnosis not present

## 2024-06-19 DIAGNOSIS — Z7984 Long term (current) use of oral hypoglycemic drugs: Secondary | ICD-10-CM | POA: Insufficient documentation

## 2024-06-19 DIAGNOSIS — F419 Anxiety disorder, unspecified: Secondary | ICD-10-CM | POA: Insufficient documentation

## 2024-06-19 DIAGNOSIS — E039 Hypothyroidism, unspecified: Secondary | ICD-10-CM | POA: Diagnosis not present

## 2024-06-19 DIAGNOSIS — E119 Type 2 diabetes mellitus without complications: Secondary | ICD-10-CM | POA: Insufficient documentation

## 2024-06-19 DIAGNOSIS — Z794 Long term (current) use of insulin: Secondary | ICD-10-CM | POA: Insufficient documentation

## 2024-06-19 DIAGNOSIS — Z9889 Other specified postprocedural states: Secondary | ICD-10-CM | POA: Diagnosis not present

## 2024-06-19 DIAGNOSIS — Z01812 Encounter for preprocedural laboratory examination: Secondary | ICD-10-CM

## 2024-06-19 DIAGNOSIS — G4730 Sleep apnea, unspecified: Secondary | ICD-10-CM | POA: Diagnosis not present

## 2024-06-19 HISTORY — PX: SHOULDER CLOSED REDUCTION: SHX1051

## 2024-06-19 HISTORY — PX: STERIOD INJECTION: SHX5046

## 2024-06-19 LAB — GLUCOSE, CAPILLARY
Glucose-Capillary: 269 mg/dL — ABNORMAL HIGH (ref 70–99)
Glucose-Capillary: 191 mg/dL — ABNORMAL HIGH (ref 70–99)
Glucose-Capillary: 254 mg/dL — ABNORMAL HIGH (ref 70–99)

## 2024-06-19 LAB — POCT PREGNANCY, URINE: Preg Test, Ur: NEGATIVE

## 2024-06-19 SURGERY — MANIPULATION, JOINT, SHOULDER, WITH ANESTHESIA
Anesthesia: General | Site: Shoulder | Laterality: Left

## 2024-06-19 MED ORDER — BUPIVACAINE HCL (PF) 0.5 % IJ SOLN
INTRAMUSCULAR | Status: AC
  Filled 2024-06-19: qty 60

## 2024-06-19 MED ORDER — TRIAMCINOLONE ACETONIDE 40 MG/ML IJ SUSP
INTRAMUSCULAR | Status: DC | PRN
  Administered 2024-06-19: 10 mL

## 2024-06-19 MED ORDER — INSULIN ASPART 100 UNIT/ML IJ SOLN
5.0000 [IU] | Freq: Once | INTRAMUSCULAR | Status: AC
  Administered 2024-06-19: 5 [IU] via SUBCUTANEOUS

## 2024-06-19 MED ORDER — INSULIN ASPART 100 UNIT/ML IJ SOLN
INTRAMUSCULAR | Status: AC
  Filled 2024-06-19: qty 1

## 2024-06-19 MED ORDER — ACETAMINOPHEN 10 MG/ML IV SOLN
1000.0000 mg | Freq: Once | INTRAVENOUS | Status: DC | PRN
  Administered 2024-06-19: 1000 mg via INTRAVENOUS

## 2024-06-19 MED ORDER — ONDANSETRON HCL 4 MG/2ML IJ SOLN
4.0000 mg | Freq: Four times a day (QID) | INTRAMUSCULAR | Status: DC | PRN
  Administered 2024-06-19: 4 mg via INTRAVENOUS

## 2024-06-19 MED ORDER — METOCLOPRAMIDE HCL 5 MG/ML IJ SOLN: 5.0000 mg | Freq: Three times a day (TID) | INTRAMUSCULAR | Status: DC | PRN

## 2024-06-19 MED ORDER — FENTANYL CITRATE (PF) 100 MCG/2ML IJ SOLN
INTRAMUSCULAR | Status: DC | PRN
  Administered 2024-06-19: 100 ug via INTRAVENOUS

## 2024-06-19 MED ORDER — HYDROCODONE-ACETAMINOPHEN 5-325 MG PO TABS: 1.0000 | ORAL_TABLET | Freq: Four times a day (QID) | ORAL | 0 refills | Status: DC | PRN

## 2024-06-19 MED ORDER — ONDANSETRON HCL 4 MG PO TABS: 4.0000 mg | ORAL_TABLET | Freq: Four times a day (QID) | ORAL | Status: DC | PRN

## 2024-06-19 MED ORDER — MIDAZOLAM HCL 2 MG/2ML IJ SOLN
INTRAMUSCULAR | Status: DC | PRN
  Administered 2024-06-19: 4 mg via INTRAVENOUS

## 2024-06-19 MED ORDER — ACETAMINOPHEN 10 MG/ML IV SOLN
INTRAVENOUS | Status: AC
Start: 2024-06-19 — End: 2024-06-19
  Filled 2024-06-19: qty 100

## 2024-06-19 MED ORDER — OXYCODONE HCL 5 MG PO TABS
ORAL_TABLET | ORAL | Status: AC
Start: 2024-06-19 — End: 2024-06-19
  Filled 2024-06-19: qty 1

## 2024-06-19 MED ORDER — KETOROLAC TROMETHAMINE 30 MG/ML IJ SOLN: 30.0000 mg | Freq: Once | INTRAMUSCULAR | Status: DC

## 2024-06-19 MED ORDER — OXYCODONE HCL 5 MG PO TABS
5.0000 mg | ORAL_TABLET | Freq: Once | ORAL | Status: AC | PRN
  Administered 2024-06-19: 5 mg via ORAL

## 2024-06-19 MED ORDER — LACTATED RINGERS IV SOLN: INTRAVENOUS | Status: DC

## 2024-06-19 MED ORDER — BUPIVACAINE-EPINEPHRINE (PF) 0.25% -1:200000 IJ SOLN
INTRAMUSCULAR | Status: AC
Start: 2024-06-19 — End: 2024-06-19
  Filled 2024-06-19: qty 30

## 2024-06-19 MED ORDER — MIDAZOLAM HCL 2 MG/2ML IJ SOLN
INTRAMUSCULAR | Status: AC
  Filled 2024-06-19: qty 2

## 2024-06-19 MED ORDER — CHLORHEXIDINE GLUCONATE 0.12 % MT SOLN
OROMUCOSAL | Status: AC
  Filled 2024-06-19: qty 15

## 2024-06-19 MED ORDER — OXYCODONE HCL 5 MG PO TABS: 5.0000 mg | ORAL_TABLET | ORAL | 0 refills | Status: AC | PRN

## 2024-06-19 MED ORDER — PROPOFOL 10 MG/ML IV BOLUS
INTRAVENOUS | Status: DC | PRN
  Administered 2024-06-19: 100 mg via INTRAVENOUS
  Administered 2024-06-19: 50 mg via INTRAVENOUS

## 2024-06-19 MED ORDER — FENTANYL CITRATE (PF) 100 MCG/2ML IJ SOLN
25.0000 ug | INTRAMUSCULAR | Status: DC | PRN
  Administered 2024-06-19 (×3): 25 ug via INTRAVENOUS
  Administered 2024-06-19: 50 ug via INTRAVENOUS

## 2024-06-19 MED ORDER — KETAMINE HCL 50 MG/5ML IJ SOSY
PREFILLED_SYRINGE | INTRAMUSCULAR | Status: AC
Start: 2024-06-19 — End: 2024-06-19
  Filled 2024-06-19: qty 5

## 2024-06-19 MED ORDER — PROPOFOL 10 MG/ML IV BOLUS
INTRAVENOUS | Status: AC
  Filled 2024-06-19: qty 60

## 2024-06-19 MED ORDER — TRIAMCINOLONE ACETONIDE 40 MG/ML IJ SUSP
INTRAMUSCULAR | Status: AC
  Filled 2024-06-19: qty 1

## 2024-06-19 MED ORDER — HYDROCODONE-ACETAMINOPHEN 5-325 MG PO TABS: 1.0000 | ORAL_TABLET | ORAL | Status: DC | PRN

## 2024-06-19 MED ORDER — FENTANYL CITRATE (PF) 100 MCG/2ML IJ SOLN
INTRAMUSCULAR | Status: AC
  Filled 2024-06-19: qty 2

## 2024-06-19 MED ORDER — ONDANSETRON HCL 4 MG/2ML IJ SOLN: 4.0000 mg | Freq: Once | INTRAMUSCULAR | Status: DC | PRN

## 2024-06-19 MED ORDER — METOCLOPRAMIDE HCL 10 MG PO TABS: 5.0000 mg | ORAL_TABLET | Freq: Three times a day (TID) | ORAL | Status: DC | PRN

## 2024-06-19 MED ORDER — CEFAZOLIN SODIUM-DEXTROSE 2-4 GM/100ML-% IV SOLN
INTRAVENOUS | Status: AC
  Filled 2024-06-19: qty 100

## 2024-06-19 MED ORDER — BUPIVACAINE-EPINEPHRINE (PF) 0.5% -1:200000 IJ SOLN
INTRAMUSCULAR | Status: AC
  Filled 2024-06-19: qty 30

## 2024-06-19 MED ORDER — OXYCODONE HCL 5 MG/5ML PO SOLN: 5.0000 mg | Freq: Once | ORAL | Status: AC | PRN

## 2024-06-19 MED ORDER — SODIUM CHLORIDE 0.9 % IV SOLN: INTRAVENOUS | Status: DC

## 2024-06-19 MED ORDER — KETAMINE HCL 50 MG/5ML IJ SOSY
PREFILLED_SYRINGE | INTRAMUSCULAR | Status: DC | PRN
  Administered 2024-06-19: 25 mg via INTRAVENOUS

## 2024-06-19 MED ORDER — ACETAMINOPHEN 325 MG PO TABS: 325.0000 mg | ORAL_TABLET | Freq: Four times a day (QID) | ORAL | Status: DC | PRN

## 2024-06-19 SURGICAL SUPPLY — 7 items
BNDG ADH 2 X3.75 FABRIC TAN LF (GAUZE/BANDAGES/DRESSINGS) ×1 IMPLANT
KIT TURNOVER KIT A (KITS) ×1 IMPLANT
NDL HYPO 21X1.5 SAFETY (NEEDLE) ×1 IMPLANT
NEEDLE HYPO 21X1.5 SAFETY (NEEDLE) ×1 IMPLANT
PAD ALCOHOL SWAB (MISCELLANEOUS) ×2 IMPLANT
SLING ARM M TX990204 (SOFTGOODS) ×1 IMPLANT
SYR 10ML LL (SYRINGE) ×1 IMPLANT

## 2024-06-19 NOTE — Discharge Instructions (Addendum)
 Orthopedic discharge instructions: May shower without restrictions. Apply ice frequently to shoulder. Resume meloxicam 15 mg daily OR take ibuprofen  600-800 mg TID with meals for 3-5 days, then as necessary. Take pain medication as prescribed when needed.  May supplement with ES Tylenol  if necessary. Use sling as necessary for first 24 hours as needed for comfort, then wean out of it. Follow-up in 10-14 days or as scheduled.

## 2024-06-19 NOTE — Anesthesia Postprocedure Evaluation (Signed)
 Anesthesia Post Note  Patient: Ashley Fox  Procedure(s) Performed: MANIPULATION, JOINT, SHOULDER, WITH ANESTHESIA (Left: Shoulder) INJECTION, STEROID, JOINT, SHOULDER (Left: Shoulder)  Patient location during evaluation: PACU Anesthesia Type: General Level of consciousness: awake and alert, oriented and patient cooperative Pain management: pain level controlled Vital Signs Assessment: post-procedure vital signs reviewed and stable Respiratory status: spontaneous breathing, nonlabored ventilation and respiratory function stable Cardiovascular status: blood pressure returned to baseline and stable Postop Assessment: adequate PO intake Anesthetic complications: no   No notable events documented.   Last Vitals:  Vitals:   06/19/24 0916 06/19/24 0925  BP:    Pulse: (!) 103 (!) 105  Resp: 10 12  Temp:    SpO2: 96% 98%    Last Pain:  Vitals:   06/19/24 0925  TempSrc:   PainSc: 2                  Koua Deeg

## 2024-06-19 NOTE — Transfer of Care (Signed)
 Immediate Anesthesia Transfer of Care Note  Patient: Giada Schoppe  Procedure(s) Performed: MANIPULATION, JOINT, SHOULDER, WITH ANESTHESIA (Left: Shoulder) INJECTION, STEROID, JOINT, SHOULDER (Left: Shoulder)  Patient Location: PACU  Anesthesia Type:General  Level of Consciousness: drowsy  Airway & Oxygen Therapy: Patient Spontanous Breathing and Patient connected to face mask  Post-op Assessment: Report given to RN, Post -op Vital signs reviewed and stable, and Patient moving all extremities  Post vital signs: Reviewed and stable  Last Vitals:  Vitals Value Taken Time  BP 115/70 06/19/24 08:31  Temp 36.4 C 06/19/24 08:31  Pulse 114 06/19/24 08:37  Resp 14 06/19/24 08:37  SpO2 99 % 06/19/24 08:37  Vitals shown include unfiled device data.  Last Pain:  Vitals:   06/19/24 0831  TempSrc:   PainSc: Asleep         Complications: No notable events documented.

## 2024-06-19 NOTE — Anesthesia Preprocedure Evaluation (Addendum)
 Anesthesia Evaluation  Patient identified by MRN, date of birth, ID band Patient awake    Reviewed: Allergy & Precautions, NPO status , Patient's Chart, lab work & pertinent test results  History of Anesthesia Complications Negative for: history of anesthetic complications  Airway Mallampati: III   Neck ROM: Full    Dental no notable dental hx.    Pulmonary sleep apnea    Pulmonary exam normal breath sounds clear to auscultation       Cardiovascular Normal cardiovascular exam Rhythm:Regular Rate:Normal  ECG 02/07/24: normal   Neuro/Psych  Headaches PSYCHIATRIC DISORDERS Anxiety        GI/Hepatic ,GERD  ,,  Endo/Other  diabetes, Type 2Hypothyroidism  PCOS  Renal/GU negative Renal ROS     Musculoskeletal   Abdominal   Peds  Hematology negative hematology ROS (+)   Anesthesia Other Findings Last dose of Mounjaro 06/10/24.  Reproductive/Obstetrics                              Anesthesia Physical Anesthesia Plan  ASA: 3  Anesthesia Plan: General   Post-op Pain Management:    Induction: Intravenous  PONV Risk Score and Plan: 3 and Propofol  infusion, TIVA and Treatment may vary due to age or medical condition  Airway Management Planned: Natural Airway  Additional Equipment:   Intra-op Plan:   Post-operative Plan:   Informed Consent: I have reviewed the patients History and Physical, chart, labs and discussed the procedure including the risks, benefits and alternatives for the proposed anesthesia with the patient or authorized representative who has indicated his/her understanding and acceptance.       Plan Discussed with: CRNA  Anesthesia Plan Comments: (LMA/GETA backup discussed.  Patient consented for risks of anesthesia including but not limited to:  - adverse reactions to medications - damage to eyes, teeth, lips or other oral mucosa - nerve damage due to positioning  -  sore throat or hoarseness - damage to heart, brain, nerves, lungs, other parts of body or loss of life  Informed patient about role of CRNA in peri- and intra-operative care.  Patient voiced understanding.)         Anesthesia Quick Evaluation

## 2024-06-19 NOTE — Op Note (Signed)
 06/19/2024  8:32 AM  Patient:   Ashley Fox  Pre-Op Diagnosis:   Secondary adhesive capsulitis, left shoulder.  Post-Op Diagnosis:   Same  Procedure:   Manipulation under anesthesia with steroid injection, left shoulder.  Surgeon:   DOROTHA Reyes Maltos, MD  Assistant:   None  Anesthesia:   IV sedation  Findings:   As above. Prior to manipulation, the left shoulder could be forward flexed to 140 and abducted to 125. At 90 of abduction, the shoulder could be externally rotated to 70 and internally rotated to 40. Following manipulation, the shoulder could be forward flexed to 170, abducted to 160 and, at 90 of abduction, externally rotated to 95 and internally rotated to 55.  Complications:   None  EBL:   0 cc  Fluids:   200 cc crystalloid  TT:   None  Drains:   None  Closure:   None  Brief Clinical Note:   The patient is a 48 year old female who is now 4+ months status post a left shoulder arthroscopy debridement, decompression, repair of a partial-thickness rotator cuff tear, and biceps tenodesis. Despite extensive physical therapy, the patient continues to have difficulty regaining shoulder range of motion. The patient's history and examination are consistent with adhesive capsulitis. The patient presents at this time for a manipulation under anesthesia with steroid injection of the left shoulder.  Procedure:   The patient was brought into the operating room and lain in the supine position. After adequate IV sedation was achieved, a timeout was performed to verify the correct surgical site. The left shoulder was gently manipulated in both abduction and external rotation, as well as adduction and internal rotation. Several palpable and audible pops were heard as the scar tissue released, permitting full range of motion of the shoulder. The glenohumeral joint was injected sterilely using 1 cc of Kenalog -40 and 9 cc of 0.25% Sensorcaine  with epinephrine  before the patient  was placed into a sling. The patient was then awakened and returned to the recovery room in satisfactory condition after tolerating the procedure well.

## 2024-06-20 ENCOUNTER — Encounter: Payer: Self-pay | Admitting: Surgery

## 2024-08-15 ENCOUNTER — Encounter: Payer: Self-pay | Admitting: Emergency Medicine

## 2024-08-15 ENCOUNTER — Emergency Department
Admission: EM | Admit: 2024-08-15 | Discharge: 2024-08-16 | Disposition: A | Discharge status: Home / Self Care | Attending: Emergency Medicine | Admitting: Emergency Medicine

## 2024-08-15 ENCOUNTER — Other Ambulatory Visit: Payer: Self-pay

## 2024-08-15 DIAGNOSIS — R1013 Epigastric pain: Secondary | ICD-10-CM | POA: Insufficient documentation

## 2024-08-15 DIAGNOSIS — E109 Type 1 diabetes mellitus without complications: Secondary | ICD-10-CM | POA: Insufficient documentation

## 2024-08-15 DIAGNOSIS — R112 Nausea with vomiting, unspecified: Secondary | ICD-10-CM | POA: Insufficient documentation

## 2024-08-15 DIAGNOSIS — E039 Hypothyroidism, unspecified: Secondary | ICD-10-CM | POA: Insufficient documentation

## 2024-08-15 LAB — CBC
HCT: 43.2 % (ref 36.0–46.0)
Hemoglobin: 15.1 g/dL — ABNORMAL HIGH (ref 12.0–15.0)
MCH: 30.3 pg (ref 26.0–34.0)
MCHC: 35 g/dL (ref 30.0–36.0)
MCV: 86.7 fL (ref 80.0–100.0)
Platelets: 311 K/uL (ref 150–400)
RBC: 4.98 MIL/uL (ref 3.87–5.11)
RDW: 11.9 % (ref 11.5–15.5)
WBC: 10.4 K/uL (ref 4.0–10.5)
nRBC: 0 % (ref 0.0–0.2)

## 2024-08-15 LAB — COMPREHENSIVE METABOLIC PANEL WITH GFR
ALT: 10 U/L (ref 0–44)
AST: 16 U/L (ref 15–41)
Albumin: 4.5 g/dL (ref 3.5–5.0)
Alkaline Phosphatase: 36 U/L — ABNORMAL LOW (ref 38–126)
Anion gap: 11 (ref 5–15)
BUN: 16 mg/dL (ref 6–20)
CO2: 26 mmol/L (ref 22–32)
Calcium: 9.4 mg/dL (ref 8.9–10.3)
Chloride: 103 mmol/L (ref 98–111)
Creatinine, Ser: 0.82 mg/dL (ref 0.44–1.00)
GFR, Estimated: >60 mL/min (ref >60)
Glucose, Bld: 78 mg/dL (ref 70–99)
Potassium: 3.6 mmol/L (ref 3.5–5.1)
Sodium: 140 mmol/L (ref 135–145)
Total Bilirubin: 0.8 mg/dL (ref 0.0–1.2)
Total Protein: 7.5 g/dL (ref 6.5–8.1)

## 2024-08-15 LAB — CBG MONITORING, ED: Glucose-Capillary: 68 mg/dL — ABNORMAL LOW (ref 70–99)

## 2024-08-15 LAB — LIPASE, BLOOD: Lipase: 22 U/L (ref 11–51)

## 2024-08-15 MED ORDER — SUCRALFATE 1 G PO TABS
1.0000 g | ORAL_TABLET | Freq: Three times a day (TID) | ORAL | 0 refills | Status: DC
Start: 2024-08-15 — End: 2024-10-07

## 2024-08-15 MED ORDER — SODIUM CHLORIDE 0.9 % IV BOLUS
1000.0000 mL | Freq: Once | INTRAVENOUS | Status: AC
  Administered 2024-08-15: 1000 mL via INTRAVENOUS

## 2024-08-15 MED ORDER — ALUM & MAG HYDROXIDE-SIMETH 200-200-20 MG/5ML PO SUSP
30.0000 mL | Freq: Once | ORAL | Status: AC
  Administered 2024-08-16: 30 mL via ORAL
  Filled 2024-08-15: qty 30

## 2024-08-15 MED ORDER — ONDANSETRON HCL 4 MG/2ML IJ SOLN
4.0000 mg | Freq: Once | INTRAMUSCULAR | Status: DC | PRN
  Filled 2024-08-15: qty 2

## 2024-08-15 MED ORDER — ONDANSETRON 8 MG PO TBDP: 8.0000 mg | ORAL_TABLET | Freq: Three times a day (TID) | ORAL | 0 refills | Status: AC | PRN

## 2024-08-15 MED ORDER — MORPHINE SULFATE (PF) 4 MG/ML IV SOLN
4.0000 mg | Freq: Once | INTRAVENOUS | Status: AC
  Administered 2024-08-15: 4 mg via INTRAVENOUS
  Filled 2024-08-15: qty 1

## 2024-08-15 NOTE — ED Notes (Signed)
 Pt given orange juice for sugar of 68, pt reports this is low for her, she is type one diabetic

## 2024-08-15 NOTE — ED Triage Notes (Signed)
 Pt arrives via POV, ambulatory to triage, gait steady, w/ no acute distress noted c/o upper abd pain that radiates to back that started around 1600 w/ belching and nausea.

## 2024-08-15 NOTE — ED Provider Notes (Signed)
 [facility] Provider Note   Event Date/Time   First MD Initiated Contact with Patient 08/15/24 2144     (approximate) History  Abdominal Pain  HPI Ashley Fox is a 48 y.o. female with a past medical history of type 1 diabetes, PCOS, hypothyroidism, and hyperlipidemia who presents complaining of nausea, epigastric abdominal pain, and increased belching that began at approximately 4:00 today.  Patient states that she initially felt epigastric discomfort overnight however did not have nausea until today.  Patient and worsens 8/10 epigastric burning pain that radiates through to her back.  Patient states that she has had a cholecystectomy ROS: Patient currently denies any vision changes, tinnitus, difficulty speaking, facial droop, sore throat, chest pain, shortness of breath, diarrhea, dysuria, or weakness/numbness/paresthesias in any extremity   Physical Exam  Triage Vital Signs: ED Triage Vitals  Encounter Vitals Group     BP 08/15/24 1948 122/78     Girls Systolic BP Percentile --      Girls Diastolic BP Percentile --      Boys Systolic BP Percentile --      Boys Diastolic BP Percentile --      Pulse Rate 08/15/24 1948 99     Resp 08/15/24 1948 18     Temp 08/15/24 1948 98 F (36.7 C)     Temp Source 08/15/24 1948 Oral     SpO2 08/15/24 1948 98 %     Weight 08/15/24 1951 140 lb (63.5 kg)     Height 08/15/24 1951 5' 5 (1.651 m)     Head Circumference --      Peak Flow --      Pain Score 08/15/24 1951 8     Pain Loc --      Pain Education --      Exclude from Growth Chart --    Most recent vital signs: Vitals:   08/15/24 1948  BP: 122/78  Pulse: 99  Resp: 18  Temp: 98 F (36.7 C)  SpO2: 98%   General: Awake, oriented x4. CV:  Good peripheral perfusion. Resp:  Normal effort. Abd:  No distention.  Epigastric tenderness to palpation Other:  Middle-aged well-developed, well-nourished Caucasian female resting comfortably in no acute  distress ED Results / Procedures / Treatments  Labs (all labs ordered are listed, but only abnormal results are displayed) Labs Reviewed  COMPREHENSIVE METABOLIC PANEL WITH GFR - Abnormal; Notable for the following components:      Result Value   Alkaline Phosphatase 36 (*)    All other components within normal limits  CBC - Abnormal; Notable for the following components:   Hemoglobin 15.1 (*)    All other components within normal limits  CBG MONITORING, ED - Abnormal; Notable for the following components:   Glucose-Capillary 68 (*)    All other components within normal limits  LIPASE, BLOOD  URINALYSIS, ROUTINE W REFLEX MICROSCOPIC  POC URINE PREG, ED   PROCEDURES: Critical Care performed: No Procedures MEDICATIONS ORDERED IN ED: Medications  ondansetron  (ZOFRAN ) injection 4 mg (has no administration in time range)  alum & mag hydroxide-simeth (MAALOX/MYLANTA) 200-200-20 MG/5ML suspension 30 mL (has no administration in time range)  morphine  (PF) 4 MG/ML injection 4 mg (4 mg Intravenous Given 08/15/24 2228)  sodium chloride  0.9 % bolus 1,000 mL (1,000 mLs Intravenous New Bag/Given 08/15/24 2230)   IMPRESSION / MDM / ASSESSMENT AND PLAN / ED COURSE  I reviewed the triage vital signs and the nursing notes.  The patient is on the cardiac monitor to evaluate for evidence of arrhythmia and/or significant heart rate changes. Patient's presentation is most consistent with acute presentation with potential threat to life or bodily function. Patient is a 48 year old female with the above-stated past medical history that presents for nausea and epigastric pain that began prior to arrival DDx: Pancreatitis, gastroenteritis, perforated peptic ulcer, Boerhaave syndrome Plan: CBC, CMP, lipase all within normal limits Glucose 68 EKG nonischemic  At this point I have low suspicion for patient having acute pancreatitis, perforated peptic ulcer, or esophageal  disease.  I am concerned that patient may have some element of gastritis with possible viral gastroenteritis.  Patient is p.o. tolerant here and pain is well-controlled with GI cocktail.  Will send prescription for Zofran  with anticipated transient course of disease.  Patient encouraged to follow-up with her primary care physician if symptoms do not resolve.  Patient agrees with plan and all questions answered prior to discharge.  Patient given strict return precautions  Dispo: Discharge home with PCP follow-up   FINAL CLINICAL IMPRESSION(S) / ED DIAGNOSES   Final diagnoses:  Epigastric pain  Nausea and vomiting, unspecified vomiting type   Rx / DC Orders   ED Discharge Orders          Ordered    ondansetron  (ZOFRAN -ODT) 8 MG disintegrating tablet  Every 8 hours PRN        08/15/24 2317    sucralfate  (CARAFATE ) 1 g tablet  3 times daily with meals & bedtime        08/15/24 2317           Note:  This document was prepared using Dragon voice recognition software and may include unintentional dictation errors.   Tallyn Holroyd K, MD 08/15/24 2322

## 2024-08-16 MED ORDER — DICYCLOMINE HCL 10 MG PO CAPS
10.0000 mg | ORAL_CAPSULE | Freq: Once | ORAL | Status: AC
  Administered 2024-08-16: 10 mg via ORAL
  Filled 2024-08-16: qty 1

## 2024-08-16 MED ORDER — FAMOTIDINE 20 MG PO TABS
20.0000 mg | ORAL_TABLET | Freq: Once | ORAL | Status: AC
  Administered 2024-08-16: 20 mg via ORAL
  Filled 2024-08-16: qty 1

## 2024-09-05 ENCOUNTER — Encounter: Payer: Self-pay | Admitting: *Deleted

## 2024-09-05 NOTE — Progress Notes (Signed)
 Jennesis Ramaswamy                                          MRN: [id number]   09/05/2024   The [facility] Quality Team Specialist reviewed this patient medical record for the purposes of chart review for care gap closure. The following were reviewed: abstraction for care gap closure-glycemic status assessment and kidney health evaluation for diabetes:eGFR  and uACR.    [facility] Quality Team

## 2024-10-03 ENCOUNTER — Encounter: Payer: Self-pay | Admitting: *Deleted

## 2024-10-03 NOTE — Progress Notes (Signed)
 Estephanie Hubbs                                          MRN: [id number]   10/03/2024   The [facility] Quality Team Specialist reviewed this patient medical record for the purposes of chart review for care gap closure. The following were reviewed: abstraction for care gap closure-glycemic status assessment and kidney health evaluation for diabetes:eGFR  and uACR.    [facility] Quality Team

## 2024-10-07 ENCOUNTER — Encounter: Payer: Self-pay | Admitting: Emergency Medicine

## 2024-10-07 ENCOUNTER — Other Ambulatory Visit: Payer: Self-pay

## 2024-10-07 ENCOUNTER — Emergency Department
Admission: EM | Admit: 2024-10-07 | Discharge: 2024-10-07 | Disposition: A | Discharge status: Home / Self Care | Attending: Emergency Medicine | Admitting: Emergency Medicine

## 2024-10-07 DIAGNOSIS — R1013 Epigastric pain: Secondary | ICD-10-CM

## 2024-10-07 DIAGNOSIS — E109 Type 1 diabetes mellitus without complications: Secondary | ICD-10-CM | POA: Diagnosis not present

## 2024-10-07 DIAGNOSIS — K529 Noninfective gastroenteritis and colitis, unspecified: Secondary | ICD-10-CM | POA: Diagnosis not present

## 2024-10-07 LAB — CBC WITH DIFFERENTIAL/PLATELET
Abs Immature Granulocytes: 0.05 K/uL (ref 0.00–0.07)
Basophils Absolute: 0.1 K/uL (ref 0.0–0.1)
Basophils Relative: 1 %
Eosinophils Absolute: 0.3 K/uL (ref 0.0–0.5)
Eosinophils Relative: 2 %
HCT: 45.4 % (ref 36.0–46.0)
Hemoglobin: 15.5 g/dL — ABNORMAL HIGH (ref 12.0–15.0)
Immature Granulocytes: 0 %
Lymphocytes Relative: 25 %
Lymphs Abs: 3.2 K/uL (ref 0.7–4.0)
MCH: 30 pg (ref 26.0–34.0)
MCHC: 34.1 g/dL (ref 30.0–36.0)
MCV: 87.8 fL (ref 80.0–100.0)
Monocytes Absolute: 0.7 K/uL (ref 0.1–1.0)
Monocytes Relative: 5 %
Neutro Abs: 8.5 K/uL — ABNORMAL HIGH (ref 1.7–7.7)
Neutrophils Relative %: 67 %
Platelets: 363 K/uL (ref 150–400)
RBC: 5.17 MIL/uL — ABNORMAL HIGH (ref 3.87–5.11)
RDW: 11.7 % (ref 11.5–15.5)
WBC: 12.7 K/uL — ABNORMAL HIGH (ref 4.0–10.5)
nRBC: 0 % (ref 0.0–0.2)

## 2024-10-07 LAB — COMPREHENSIVE METABOLIC PANEL WITH GFR
ALT: 11 U/L (ref 0–44)
AST: 17 U/L (ref 15–41)
Albumin: 4.6 g/dL (ref 3.5–5.0)
Alkaline Phosphatase: 40 U/L (ref 38–126)
Anion gap: 12 (ref 5–15)
BUN: 17 mg/dL (ref 6–20)
CO2: 26 mmol/L (ref 22–32)
Calcium: 10.1 mg/dL (ref 8.9–10.3)
Chloride: 103 mmol/L (ref 98–111)
Creatinine, Ser: 0.71 mg/dL (ref 0.44–1.00)
GFR, Estimated: >60 mL/min (ref >60)
Glucose, Bld: 139 mg/dL — ABNORMAL HIGH (ref 70–99)
Potassium: 3.9 mmol/L (ref 3.5–5.1)
Sodium: 141 mmol/L (ref 135–145)
Total Bilirubin: 0.6 mg/dL (ref 0.0–1.2)
Total Protein: 7.2 g/dL (ref 6.5–8.1)

## 2024-10-07 LAB — TROPONIN T, HIGH SENSITIVITY: Troponin T High Sensitivity: <15 ng/L (ref 0–19)

## 2024-10-07 LAB — LIPASE, BLOOD: Lipase: 24 U/L (ref 11–51)

## 2024-10-07 MED ORDER — DICYCLOMINE HCL 10 MG PO CAPS: 10.0000 mg | ORAL_CAPSULE | Freq: Three times a day (TID) | ORAL | 0 refills | Status: AC | PRN

## 2024-10-07 MED ORDER — SUCRALFATE 1 G PO TABS
1.0000 g | ORAL_TABLET | Freq: Four times a day (QID) | ORAL | 1 refills | Status: AC | PRN
Start: 2024-10-07 — End: ?

## 2024-10-07 MED ORDER — FAMOTIDINE IN NACL 20-0.9 MG/50ML-% IV SOLN
20.0000 mg | Freq: Once | INTRAVENOUS | Status: AC
  Administered 2024-10-07: 20 mg via INTRAVENOUS
  Filled 2024-10-07: qty 50

## 2024-10-07 MED ORDER — ALUM & MAG HYDROXIDE-SIMETH 200-200-20 MG/5ML PO SUSP
30.0000 mL | Freq: Once | ORAL | Status: AC
  Administered 2024-10-07: 30 mL via ORAL
  Filled 2024-10-07: qty 30

## 2024-10-07 MED ORDER — PANTOPRAZOLE SODIUM 40 MG IV SOLR
40.0000 mg | Freq: Once | INTRAVENOUS | Status: AC
  Administered 2024-10-07: 40 mg via INTRAVENOUS
  Filled 2024-10-07: qty 10

## 2024-10-07 MED ORDER — MORPHINE SULFATE (PF) 4 MG/ML IV SOLN
4.0000 mg | Freq: Once | INTRAVENOUS | Status: AC
  Administered 2024-10-07: 4 mg via INTRAVENOUS
  Filled 2024-10-07: qty 1

## 2024-10-07 MED ORDER — LIDOCAINE VISCOUS HCL 2 % MT SOLN
15.0000 mL | Freq: Once | OROMUCOSAL | Status: AC
  Administered 2024-10-07: 15 mL via OROMUCOSAL
  Filled 2024-10-07: qty 15

## 2024-10-07 MED ORDER — METOCLOPRAMIDE HCL 10 MG PO TABS: 10.0000 mg | ORAL_TABLET | Freq: Four times a day (QID) | ORAL | 0 refills | Status: AC | PRN

## 2024-10-07 MED ORDER — METOCLOPRAMIDE HCL 5 MG/ML IJ SOLN
10.0000 mg | INTRAMUSCULAR | Status: AC
  Administered 2024-10-07: 10 mg via INTRAVENOUS
  Filled 2024-10-07: qty 2

## 2024-10-07 MED ORDER — DICYCLOMINE HCL 10 MG PO CAPS
20.0000 mg | ORAL_CAPSULE | Freq: Once | ORAL | Status: AC
  Administered 2024-10-07: 20 mg via ORAL
  Filled 2024-10-07: qty 2

## 2024-10-07 NOTE — ED Provider Notes (Signed)
 [facility] Provider Note    Event Date/Time   First MD Initiated Contact with Patient 10/07/24 [id number]     (approximate)   History   Abdominal Pain (epigastric)   HPI Ashley Fox is a 48 y.o. female whose past medical history includes prior episodes of gastritis or gastroenteritis as well as a prior cholecystectomy in 2014 and a history of generally well-controlled type 1 diabetes.  She presents for evaluation of acute onset burning and sharp epigastric pain that is radiating through to her back.  This has happened at least 3 times in the last 2 to 3 months as well as several times in the previous year.  A specific cause has not been able to be identified including with a CT scan in June 2024.  Typically the symptoms start with sulfur burps and epigastric distention and discomfort, to be followed by being very gassy and having multiple episodes of diarrhea.  All of these things are happening tonight.  She has no chest pain or shortness of breath and no prior cardiac history.  She has not consumed any alcohol and has not started on any new medications recently.  She tried taking 2 Prilosec tonight after the symptoms started as well as some Milk of Magnesia and Zofran , but nothing helps.     Physical Exam   ED Triage Vitals  Encounter Vitals Group     BP 10/07/24 0225 (!) 127/92     Girls Systolic BP Percentile --      Girls Diastolic BP Percentile --      Boys Systolic BP Percentile --      Boys Diastolic BP Percentile --      Pulse Rate 10/07/24 0225 94     Resp 10/07/24 0225 18     Temp 10/07/24 0225 97.8 F (36.6 C)     Temp Source 10/07/24 0225 Oral     SpO2 10/07/24 0225 100 %     Weight 10/07/24 0224 63.5 kg (140 lb)     Height 10/07/24 0224 1.651 m (5' 5)     Head Circumference --      Peak Flow --      Pain Score 10/07/24 0223 10     Pain Loc --      Pain Education --      Exclude from Growth Chart --       Most recent vital  signs: Vitals:   10/07/24 0300 10/07/24 0400  BP: 115/70 105/71  Pulse: 85 95  Resp: 18 19  Temp:    SpO2: 100% 96%    General: Awake, appears very uncomfortable and in pain but nontoxic. CV:  Good peripheral perfusion.  Resp:  Normal effort. Speaking easily and comfortably, no accessory muscle usage nor intercostal retractions.   Abd:  No distention.  Abdomen is soft.  She has tenderness to palpation throughout the abdomen but with no focal or localized peritonitis.  Tenderness is worse in the epigastrium and right upper quadrant with equivocal Murphy sign.   ED Results / Procedures / Treatments   Labs (all labs ordered are listed, but only abnormal results are displayed) Labs Reviewed  CBC WITH DIFFERENTIAL/PLATELET - Abnormal; Notable for the following components:      Result Value   WBC 12.7 (*)    RBC 5.17 (*)    Hemoglobin 15.5 (*)    Neutro Abs 8.5 (*)    All other components within normal limits  COMPREHENSIVE METABOLIC PANEL WITH  GFR - Abnormal; Notable for the following components:   Glucose, Bld 139 (*)    All other components within normal limits  LIPASE, BLOOD  TROPONIN T, HIGH SENSITIVITY     EKG  ED ECG REPORT I, Darleene Dome, the attending physician, personally viewed and interpreted this ECG.  Date: 10/07/2024 EKG Time: 2:26 AM Rate: 88 Rhythm: normal sinus rhythm QRS Axis: normal Intervals: normal ST/T Wave abnormalities: normal Narrative Interpretation: no evidence of acute ischemia    RADIOLOGY See ED course for details   PROCEDURES:  Critical Care performed: No  Procedures    IMPRESSION / MDM / ASSESSMENT AND PLAN / ED COURSE  I reviewed the triage vital signs and the nursing notes.                              Differential diagnosis includes, but is not limited to, gastritis/gastroenteritis (viral versus bacterial), acid reflux, hiatal hernia, choledocholithiasis from hepatic stone, SBO, ileus, gastroparesis.  Patient's  presentation is most consistent with acute presentation with potential threat to life or bodily function.  Labs/studies ordered: High sensitive troponin, lipase, CMP, CBC with differential, EKG  Interventions/Medications given:  Medications  dicyclomine  (BENTYL ) capsule 20 mg (20 mg Oral Given 10/07/24 0320)  alum & mag hydroxide-simeth (MAALOX/MYLANTA) 200-200-20 MG/5ML suspension 30 mL (30 mLs Oral Given 10/07/24 0320)  lidocaine  (XYLOCAINE ) 2 % viscous mouth solution 15 mL (15 mLs Mouth/Throat Given 10/07/24 0320)  metoCLOPramide  (REGLAN ) injection 10 mg (10 mg Intravenous Given 10/07/24 0245)  morphine  (PF) 4 MG/ML injection 4 mg (4 mg Intravenous Given 10/07/24 0249)  famotidine  (PEPCID ) IVPB 20 mg premix (0 mg Intravenous Stopped 10/07/24 0321)  pantoprazole (PROTONIX) injection 40 mg (40 mg Intravenous Given 10/07/24 0250)    (Note:  hospital course my include additional interventions and/or labs/studies not listed above.)   Doubt cardiac etiology and EKG shows no sign of ischemia (troponin pending to further rule out cardiac etiology), I reviewed the medical record and see that the patient has had multiple similar prior presentations.  Symptoms are suggestive of biliary colic but the patient had a cholecystectomy about 11 years ago.  It is unlikely she has developed hepatic stones that have resulted in choledocholithiasis, and an ultrasound is unlikely to be useful.  Will check labs and I will reassess after symptomatic treatment with medications listed above including IV morphine .  We will discuss at that time whether a CT scan would be beneficial.  She agrees with the plan.     Clinical Course as of 10/07/24 0417  Mon Oct 07, 2024  0306 CBC with Differential(!) Minimal leukocytosis of unclear clinical significance [CF]  0412 Reassessed the patient.  She is feeling much better at this time and says she is ready to go home.  Upon palpation she has very minimal tenderness but much  improved from prior with no guarding.  We discussed a CT scan but neither 1 of us  feel it is necessary at this time.  Prescriptions as listed below, and I strongly encouraged outpatient follow-up to see gastroenterology for an endoscopy which she has not had in at least 11 years.  She agrees with the plan.  I gave my usual and customary return precautions [CF]    Clinical Course User Index [CF] Dome Darleene, MD     FINAL CLINICAL IMPRESSION(S) / ED DIAGNOSES   Final diagnoses:  Epigastric pain  Gastroenteritis     Rx / DC  Orders   ED Discharge Orders          Ordered    dicyclomine  (BENTYL ) 10 MG capsule  3 times daily PRN        10/07/24 0417    metoCLOPramide  (REGLAN ) 10 MG tablet  Every 6 hours PRN        10/07/24 0417    sucralfate  (CARAFATE ) 1 g tablet  4 times daily PRN        10/07/24 0417             Note:  This document was prepared using Dragon voice recognition software and may include unintentional dictation errors.   Gordan Huxley, MD 10/07/24 [phone]

## 2024-10-07 NOTE — Discharge Instructions (Signed)
 Your evaluation tonight was generally reassuring.  It is difficult to know at this time whether you were exposed to a virus (viral gastroenteritis), foodborne pathogen, or if this is an exacerbation of acid reflux.  Please try taking the medications we prescribed tonight as well as continuing with your PPI.  Follow-up with your regular primary care provider and consider scheduling appointment with a gastroenterologist; you will likely benefit from an endoscopy for further assessment.    Return to the emergency department if you develop new or worsening symptoms that concern you.

## 2024-10-07 NOTE — ED Notes (Signed)
 EDP at bedside

## 2024-10-07 NOTE — ED Triage Notes (Signed)
 To ER from home with report of all day epigastric pain and sulfa burps. Took 3 prilosec and tried eating that made pain worse. Zofran  at 9p with no effect. Feels like she is going to throw up. Reports 3rd incident of this in the last 3 months.

## 2024-10-07 NOTE — ED Notes (Signed)
 Pt to nauseated to take PO medications at this time. Pt instructed to let this RN know when she felt like she could take them.

## 2024-10-21 ENCOUNTER — Ambulatory Visit

## 2024-11-26 ENCOUNTER — Encounter: Payer: Self-pay | Admitting: Surgery
